# Patient Record
Sex: Female | Born: 1965 | Race: Black or African American | Hispanic: No | Smoking: Current every day smoker
Health system: Southern US, Community
[De-identification: ages and names within clinical notes are randomized; demographics above are authoritative.]

## PROBLEM LIST (undated history)

## (undated) DIAGNOSIS — I509 Heart failure, unspecified: Secondary | ICD-10-CM

---

## 2015-03-24 ENCOUNTER — Inpatient Hospital Stay (HOSPITAL_COMMUNITY): Payer: BLUE CROSS/BLUE SHIELD

## 2015-03-24 ENCOUNTER — Encounter (HOSPITAL_COMMUNITY): Admission: RE | Disposition: E | Payer: Self-pay | Source: Home / Self Care | Attending: Pulmonary Disease

## 2015-03-24 ENCOUNTER — Emergency Department (HOSPITAL_COMMUNITY): Payer: BLUE CROSS/BLUE SHIELD

## 2015-03-24 ENCOUNTER — Encounter (HOSPITAL_COMMUNITY): Payer: Self-pay

## 2015-03-24 DIAGNOSIS — I429 Cardiomyopathy, unspecified: Secondary | ICD-10-CM | POA: Diagnosis present

## 2015-03-24 DIAGNOSIS — I5023 Acute on chronic systolic (congestive) heart failure: Secondary | ICD-10-CM | POA: Diagnosis present

## 2015-03-24 DIAGNOSIS — I2119 ST elevation (STEMI) myocardial infarction involving other coronary artery of inferior wall: Secondary | ICD-10-CM | POA: Diagnosis present

## 2015-03-24 DIAGNOSIS — Z9114 Patient's other noncompliance with medication regimen: Secondary | ICD-10-CM | POA: Diagnosis present

## 2015-03-24 DIAGNOSIS — I4901 Ventricular fibrillation: Secondary | ICD-10-CM | POA: Diagnosis present

## 2015-03-24 DIAGNOSIS — J811 Chronic pulmonary edema: Secondary | ICD-10-CM

## 2015-03-24 DIAGNOSIS — R739 Hyperglycemia, unspecified: Secondary | ICD-10-CM | POA: Diagnosis present

## 2015-03-24 DIAGNOSIS — Z515 Encounter for palliative care: Secondary | ICD-10-CM

## 2015-03-24 DIAGNOSIS — Z66 Do not resuscitate: Secondary | ICD-10-CM

## 2015-03-24 DIAGNOSIS — G931 Anoxic brain damage, not elsewhere classified: Secondary | ICD-10-CM | POA: Insufficient documentation

## 2015-03-24 DIAGNOSIS — R570 Cardiogenic shock: Secondary | ICD-10-CM

## 2015-03-24 DIAGNOSIS — E872 Acidosis: Secondary | ICD-10-CM | POA: Diagnosis present

## 2015-03-24 DIAGNOSIS — E876 Hypokalemia: Secondary | ICD-10-CM | POA: Diagnosis present

## 2015-03-24 DIAGNOSIS — I272 Other secondary pulmonary hypertension: Secondary | ICD-10-CM | POA: Diagnosis present

## 2015-03-24 DIAGNOSIS — Z9119 Patient's noncompliance with other medical treatment and regimen: Secondary | ICD-10-CM | POA: Diagnosis present

## 2015-03-24 DIAGNOSIS — F17210 Nicotine dependence, cigarettes, uncomplicated: Secondary | ICD-10-CM | POA: Diagnosis present

## 2015-03-24 DIAGNOSIS — J14 Pneumonia due to Hemophilus influenzae: Secondary | ICD-10-CM | POA: Diagnosis present

## 2015-03-24 DIAGNOSIS — G253 Myoclonus: Secondary | ICD-10-CM | POA: Diagnosis present

## 2015-03-24 DIAGNOSIS — N179 Acute kidney failure, unspecified: Secondary | ICD-10-CM | POA: Diagnosis present

## 2015-03-24 DIAGNOSIS — I213 ST elevation (STEMI) myocardial infarction of unspecified site: Secondary | ICD-10-CM

## 2015-03-24 DIAGNOSIS — I2510 Atherosclerotic heart disease of native coronary artery without angina pectoris: Secondary | ICD-10-CM

## 2015-03-24 DIAGNOSIS — I2102 ST elevation (STEMI) myocardial infarction involving left anterior descending coronary artery: Secondary | ICD-10-CM

## 2015-03-24 DIAGNOSIS — R4020 Unspecified coma: Secondary | ICD-10-CM | POA: Diagnosis not present

## 2015-03-24 DIAGNOSIS — J9601 Acute respiratory failure with hypoxia: Secondary | ICD-10-CM | POA: Diagnosis present

## 2015-03-24 DIAGNOSIS — D649 Anemia, unspecified: Secondary | ICD-10-CM | POA: Diagnosis present

## 2015-03-24 DIAGNOSIS — I219 Acute myocardial infarction, unspecified: Secondary | ICD-10-CM | POA: Insufficient documentation

## 2015-03-24 DIAGNOSIS — Z978 Presence of other specified devices: Secondary | ICD-10-CM

## 2015-03-24 DIAGNOSIS — R0902 Hypoxemia: Secondary | ICD-10-CM | POA: Insufficient documentation

## 2015-03-24 DIAGNOSIS — E870 Hyperosmolality and hypernatremia: Secondary | ICD-10-CM | POA: Diagnosis not present

## 2015-03-24 DIAGNOSIS — I469 Cardiac arrest, cause unspecified: Secondary | ICD-10-CM | POA: Diagnosis present

## 2015-03-24 DIAGNOSIS — Z4659 Encounter for fitting and adjustment of other gastrointestinal appliance and device: Secondary | ICD-10-CM

## 2015-03-24 HISTORY — DX: Heart failure, unspecified: I50.9

## 2015-03-24 HISTORY — PX: CARDIAC CATHETERIZATION: SHX172

## 2015-03-24 LAB — I-STAT CHEM 8, ED
BUN: 22 mg/dL — ABNORMAL HIGH (ref 6–20)
CALCIUM ION: 1 mmol/L — AB (ref 1.12–1.23)
Chloride: 106 mmol/L (ref 101–111)
Creatinine, Ser: 1.3 mg/dL — ABNORMAL HIGH (ref 0.44–1.00)
GLUCOSE: 263 mg/dL — AB (ref 65–99)
HEMATOCRIT: 34 % — AB (ref 36.0–46.0)
HEMOGLOBIN: 11.6 g/dL — AB (ref 12.0–15.0)
Potassium: 3.4 mmol/L — ABNORMAL LOW (ref 3.5–5.1)
SODIUM: 141 mmol/L (ref 135–145)
TCO2: 18 mmol/L (ref 0–100)

## 2015-03-24 LAB — CBC WITH DIFFERENTIAL/PLATELET
BASOS ABS: 0 10*3/uL (ref 0.0–0.1)
Basophils Relative: 0 % (ref 0–1)
Eosinophils Absolute: 0.3 10*3/uL (ref 0.0–0.7)
Eosinophils Relative: 1 % (ref 0–5)
HCT: 32.7 % — ABNORMAL LOW (ref 36.0–46.0)
Hemoglobin: 8.9 g/dL — ABNORMAL LOW (ref 12.0–15.0)
LYMPHS ABS: 2.4 10*3/uL (ref 0.7–4.0)
LYMPHS PCT: 8 % — AB (ref 12–46)
MCH: 19.3 pg — ABNORMAL LOW (ref 26.0–34.0)
MCHC: 27.2 g/dL — ABNORMAL LOW (ref 30.0–36.0)
MCV: 70.8 fL — ABNORMAL LOW (ref 78.0–100.0)
MONO ABS: 0.9 10*3/uL (ref 0.1–1.0)
Monocytes Relative: 3 % (ref 3–12)
Neutro Abs: 26.7 10*3/uL — ABNORMAL HIGH (ref 1.7–7.7)
Neutrophils Relative %: 88 % — ABNORMAL HIGH (ref 43–77)
PLATELETS: 268 10*3/uL (ref 150–400)
RBC: 4.62 MIL/uL (ref 3.87–5.11)
RDW: 19.9 % — ABNORMAL HIGH (ref 11.5–15.5)
WBC: 30.3 10*3/uL — ABNORMAL HIGH (ref 4.0–10.5)

## 2015-03-24 LAB — POCT I-STAT, CHEM 8
BUN: 22 mg/dL — ABNORMAL HIGH (ref 6–20)
BUN: 21 mg/dL — AB (ref 6–20)
CALCIUM ION: 1.14 mmol/L (ref 1.12–1.23)
CHLORIDE: 106 mmol/L (ref 101–111)
CREATININE: 1.2 mg/dL — AB (ref 0.44–1.00)
Calcium, Ion: 1.11 mmol/L — ABNORMAL LOW (ref 1.12–1.23)
Chloride: 104 mmol/L (ref 101–111)
Creatinine, Ser: 1.1 mg/dL — ABNORMAL HIGH (ref 0.44–1.00)
Glucose, Bld: 210 mg/dL — ABNORMAL HIGH (ref 65–99)
Glucose, Bld: 160 mg/dL — ABNORMAL HIGH (ref 65–99)
HCT: 35 % — ABNORMAL LOW (ref 36.0–46.0)
HCT: 36 % (ref 36.0–46.0)
HEMOGLOBIN: 11.9 g/dL — AB (ref 12.0–15.0)
Hemoglobin: 12.2 g/dL (ref 12.0–15.0)
POTASSIUM: 3.5 mmol/L (ref 3.5–5.1)
Potassium: 3.4 mmol/L — ABNORMAL LOW (ref 3.5–5.1)
SODIUM: 141 mmol/L (ref 135–145)
Sodium: 143 mmol/L (ref 135–145)
TCO2: 19 mmol/L (ref 0–100)
TCO2: 20 mmol/L (ref 0–100)

## 2015-03-24 LAB — BASIC METABOLIC PANEL
ANION GAP: 9 (ref 5–15)
Anion gap: 8 (ref 5–15)
BUN: 22 mg/dL — AB (ref 6–20)
BUN: 20 mg/dL (ref 6–20)
CO2: 22 mmol/L (ref 22–32)
CO2: 23 mmol/L (ref 22–32)
Calcium: 7.6 mg/dL — ABNORMAL LOW (ref 8.9–10.3)
Calcium: 8.2 mg/dL — ABNORMAL LOW (ref 8.9–10.3)
Chloride: 109 mmol/L (ref 101–111)
Chloride: 109 mmol/L (ref 101–111)
Creatinine, Ser: 1.4 mg/dL — ABNORMAL HIGH (ref 0.44–1.00)
Creatinine, Ser: 1.14 mg/dL — ABNORMAL HIGH (ref 0.44–1.00)
GFR calc Af Amer: >60 mL/min (ref >60)
GFR calc non Af Amer: 44 mL/min — ABNORMAL LOW (ref >60)
GFR, EST AFRICAN AMERICAN: 51 mL/min — AB (ref >60)
GFR, EST NON AFRICAN AMERICAN: 57 mL/min — AB (ref >60)
GLUCOSE: 220 mg/dL — AB (ref 65–99)
GLUCOSE: 139 mg/dL — AB (ref 65–99)
Potassium: 3.4 mmol/L — ABNORMAL LOW (ref 3.5–5.1)
Potassium: 3.8 mmol/L (ref 3.5–5.1)
SODIUM: 139 mmol/L (ref 135–145)
SODIUM: 141 mmol/L (ref 135–145)

## 2015-03-24 LAB — I-STAT CG4 LACTIC ACID, ED: Lactic Acid, Venous: 6.15 mmol/L (ref 0.5–2.0)

## 2015-03-24 LAB — POCT I-STAT 3, ART BLOOD GAS (G3+)
Acid-base deficit: 8 mmol/L — ABNORMAL HIGH (ref 0.0–2.0)
Acid-base deficit: 5 mmol/L — ABNORMAL HIGH (ref 0.0–2.0)
BICARBONATE: 19.6 meq/L — AB (ref 20.0–24.0)
BICARBONATE: 20.9 meq/L (ref 20.0–24.0)
O2 Saturation: 99 %
O2 Saturation: 100 %
PO2 ART: 295 mmHg — AB (ref 80.0–100.0)
Patient temperature: 35.5
TCO2: 21 mmol/L (ref 0–100)
TCO2: 22 mmol/L (ref 0–100)
pCO2 arterial: 45.2 mmHg — ABNORMAL HIGH (ref 35.0–45.0)
pCO2 arterial: 38.4 mmHg (ref 35.0–45.0)
pH, Arterial: 7.243 — ABNORMAL LOW (ref 7.350–7.450)
pH, Arterial: 7.336 — ABNORMAL LOW (ref 7.350–7.450)
pO2, Arterial: 135 mmHg — ABNORMAL HIGH (ref 80.0–100.0)

## 2015-03-24 LAB — I-STAT TROPONIN, ED: TROPONIN I, POC: 0.15 ng/mL — AB (ref 0.00–0.08)

## 2015-03-24 LAB — COMPREHENSIVE METABOLIC PANEL
ALBUMIN: 3 g/dL — AB (ref 3.5–5.0)
ALT: 55 U/L — ABNORMAL HIGH (ref 14–54)
ANION GAP: 15 (ref 5–15)
AST: 84 U/L — ABNORMAL HIGH (ref 15–41)
Alkaline Phosphatase: 52 U/L (ref 38–126)
BILIRUBIN TOTAL: 1 mg/dL (ref 0.3–1.2)
BUN: 18 mg/dL (ref 6–20)
CO2: 18 mmol/L — ABNORMAL LOW (ref 22–32)
Calcium: 7.6 mg/dL — ABNORMAL LOW (ref 8.9–10.3)
Chloride: 105 mmol/L (ref 101–111)
Creatinine, Ser: 1.36 mg/dL — ABNORMAL HIGH (ref 0.44–1.00)
GFR calc Af Amer: 53 mL/min — ABNORMAL LOW (ref >60)
GFR calc non Af Amer: 46 mL/min — ABNORMAL LOW (ref >60)
GLUCOSE: 265 mg/dL — AB (ref 65–99)
Potassium: 3.1 mmol/L — ABNORMAL LOW (ref 3.5–5.1)
SODIUM: 138 mmol/L (ref 135–145)
Total Protein: 6.1 g/dL — ABNORMAL LOW (ref 6.5–8.1)

## 2015-03-24 LAB — GLUCOSE, CAPILLARY
GLUCOSE-CAPILLARY: 174 mg/dL — AB (ref 65–99)
GLUCOSE-CAPILLARY: 167 mg/dL — AB (ref 65–99)
GLUCOSE-CAPILLARY: 84 mg/dL (ref 65–99)
GLUCOSE-CAPILLARY: 145 mg/dL — AB (ref 65–99)
GLUCOSE-CAPILLARY: 151 mg/dL — AB (ref 65–99)
Glucose-Capillary: 210 mg/dL — ABNORMAL HIGH (ref 65–99)
Glucose-Capillary: 147 mg/dL — ABNORMAL HIGH (ref 65–99)
Glucose-Capillary: 175 mg/dL — ABNORMAL HIGH (ref 65–99)

## 2015-03-24 LAB — RAPID URINE DRUG SCREEN, HOSP PERFORMED
Amphetamines: POSITIVE — AB
Barbiturates: NOT DETECTED
Benzodiazepines: NOT DETECTED
COCAINE: NOT DETECTED
Opiates: NOT DETECTED
Tetrahydrocannabinol: NOT DETECTED

## 2015-03-24 LAB — POCT ACTIVATED CLOTTING TIME: Activated Clotting Time: 374 seconds

## 2015-03-24 LAB — PROTIME-INR
INR: 1.41 (ref 0.00–1.49)
INR: 1.6 — ABNORMAL HIGH (ref 0.00–1.49)
INR: 1.95 — ABNORMAL HIGH (ref 0.00–1.49)
PROTHROMBIN TIME: 22.1 s — AB (ref 11.6–15.2)
Prothrombin Time: 17.4 seconds — ABNORMAL HIGH (ref 11.6–15.2)
Prothrombin Time: 19.1 seconds — ABNORMAL HIGH (ref 11.6–15.2)

## 2015-03-24 LAB — PLATELET COUNT: Platelets: 289 10*3/uL (ref 150–400)

## 2015-03-24 LAB — MRSA PCR SCREENING: MRSA by PCR: NEGATIVE

## 2015-03-24 LAB — TROPONIN I
Troponin I: 18.61 ng/mL (ref <0.031)
Troponin I: 40.99 ng/mL (ref <0.031)

## 2015-03-24 LAB — MAGNESIUM: Magnesium: 2 mg/dL (ref 1.7–2.4)

## 2015-03-24 LAB — APTT
APTT: 26 s (ref 24–37)
aPTT: 36 seconds (ref 24–37)

## 2015-03-24 SURGERY — LEFT HEART CATH AND CORONARY ANGIOGRAPHY

## 2015-03-24 MED ORDER — HEPARIN (PORCINE) IN NACL 100-0.45 UNIT/ML-% IJ SOLN
1600.0000 [IU]/h | INTRAMUSCULAR | Status: DC
  Administered 2015-03-24: 650 [IU]/h via INTRAVENOUS
  Administered 2015-03-25: 1100 [IU]/h via INTRAVENOUS
  Administered 2015-03-26: 1450 [IU]/h via INTRAVENOUS
  Administered 2015-03-27 – 2015-03-29 (×5): 1700 [IU]/h via INTRAVENOUS
  Filled 2015-03-24 (×19): qty 250

## 2015-03-24 MED ORDER — SODIUM CHLORIDE 0.9 % IJ SOLN
3.0000 mL | Freq: Two times a day (BID) | INTRAMUSCULAR | Status: DC
  Administered 2015-03-24 – 2015-03-30 (×11): 3 mL via INTRAVENOUS

## 2015-03-24 MED ORDER — SODIUM CHLORIDE 0.9 % IJ SOLN
3.0000 mL | Freq: Two times a day (BID) | INTRAMUSCULAR | Status: DC
  Administered 2015-03-25 – 2015-03-27 (×3): 3 mL via INTRAVENOUS
  Administered 2015-03-28: 10 mL via INTRAVENOUS
  Administered 2015-03-30 – 2015-04-01 (×3): 3 mL via INTRAVENOUS

## 2015-03-24 MED ORDER — CHLORHEXIDINE GLUCONATE 0.12 % MT SOLN
15.0000 mL | Freq: Two times a day (BID) | OROMUCOSAL | Status: DC
  Administered 2015-03-24 – 2015-03-31 (×14): 15 mL via OROMUCOSAL
  Filled 2015-03-24 (×12): qty 15

## 2015-03-24 MED ORDER — SODIUM CHLORIDE 0.9 % IV SOLN
25.0000 ug/h | INTRAVENOUS | Status: DC
  Administered 2015-03-24: 50 ug/h via INTRAVENOUS
  Administered 2015-03-25 (×2): 200 ug/h via INTRAVENOUS
  Filled 2015-03-24 (×3): qty 50

## 2015-03-24 MED ORDER — LIDOCAINE HCL (CARDIAC) 20 MG/ML IV SOLN
INTRAVENOUS | Status: AC
  Filled 2015-03-24: qty 5

## 2015-03-24 MED ORDER — HEPARIN SODIUM (PORCINE) 1000 UNIT/ML IJ SOLN
INTRAMUSCULAR | Status: AC
  Filled 2015-03-24: qty 1

## 2015-03-24 MED ORDER — NOREPINEPHRINE BITARTRATE 1 MG/ML IV SOLN
0.0000 ug/min | INTRAVENOUS | Status: DC
  Filled 2015-03-24: qty 4

## 2015-03-24 MED ORDER — ONDANSETRON HCL 4 MG/2ML IJ SOLN: 4.0000 mg | Freq: Four times a day (QID) | INTRAMUSCULAR | Status: DC | PRN

## 2015-03-24 MED ORDER — ROCURONIUM BROMIDE 50 MG/5ML IV SOLN
INTRAVENOUS | Status: AC
  Administered 2015-03-24: 70 mg
  Filled 2015-03-24: qty 2

## 2015-03-24 MED ORDER — HEPARIN SODIUM (PORCINE) 1000 UNIT/ML IJ SOLN
INTRAMUSCULAR | Status: DC | PRN
  Administered 2015-03-24: 4000 [IU] via INTRAVENOUS

## 2015-03-24 MED ORDER — SODIUM CHLORIDE 0.9 % IJ SOLN: 3.0000 mL | INTRAMUSCULAR | Status: DC | PRN

## 2015-03-24 MED ORDER — FENTANYL CITRATE (PF) 100 MCG/2ML IJ SOLN: 100.0000 ug | Freq: Once | INTRAMUSCULAR | Status: DC

## 2015-03-24 MED ORDER — HEPARIN (PORCINE) IN NACL 2-0.9 UNIT/ML-% IJ SOLN
INTRAMUSCULAR | Status: AC
  Filled 2015-03-24: qty 1000

## 2015-03-24 MED ORDER — FENTANYL BOLUS VIA INFUSION
50.0000 ug | INTRAVENOUS | Status: DC | PRN
  Administered 2015-03-26: 50 ug via INTRAVENOUS
  Filled 2015-03-24: qty 50

## 2015-03-24 MED ORDER — ETOMIDATE 2 MG/ML IV SOLN
INTRAVENOUS | Status: AC
  Administered 2015-03-24: 30 mg
  Filled 2015-03-24: qty 20

## 2015-03-24 MED ORDER — POTASSIUM CHLORIDE 10 MEQ/100ML IV SOLN
INTRAVENOUS | Status: AC
  Filled 2015-03-24: qty 100

## 2015-03-24 MED ORDER — TIROFIBAN HCL IV 5 MG/100ML
INTRAVENOUS | Status: AC
  Filled 2015-03-24: qty 100

## 2015-03-24 MED ORDER — ACETAMINOPHEN 325 MG PO TABS: 650.0000 mg | ORAL_TABLET | ORAL | Status: DC | PRN

## 2015-03-24 MED ORDER — SODIUM CHLORIDE 0.9 % IV SOLN
25.0000 ug/h | INTRAVENOUS | Status: DC
  Filled 2015-03-24: qty 50

## 2015-03-24 MED ORDER — TICAGRELOR 90 MG PO TABS
ORAL_TABLET | ORAL | Status: AC
  Filled 2015-03-24: qty 2

## 2015-03-24 MED ORDER — TICAGRELOR 90 MG PO TABS
90.0000 mg | ORAL_TABLET | Freq: Two times a day (BID) | ORAL | Status: DC
  Administered 2015-03-24 – 2015-03-25 (×2): 90 mg via ORAL
  Filled 2015-03-24 (×4): qty 1

## 2015-03-24 MED ORDER — SODIUM CHLORIDE 0.9 % IV SOLN: 2000.0000 mL | Freq: Once | INTRAVENOUS | Status: AC

## 2015-03-24 MED ORDER — SODIUM CHLORIDE 0.9 % IV SOLN
250.0000 mg | INTRAVENOUS | Status: DC | PRN
  Administered 2015-03-24 (×2): 1.75 mg/kg/h via INTRAVENOUS

## 2015-03-24 MED ORDER — NOREPINEPHRINE BITARTRATE 1 MG/ML IV SOLN
0.0000 ug/min | INTRAVENOUS | Status: DC
  Administered 2015-03-24: 5 ug/min via INTRAVENOUS
  Administered 2015-03-25: 7 ug/min via INTRAVENOUS
  Filled 2015-03-24 (×3): qty 4

## 2015-03-24 MED ORDER — INSULIN ASPART 100 UNIT/ML ~~LOC~~ SOLN
2.0000 [IU] | SUBCUTANEOUS | Status: DC
  Administered 2015-03-24: 2 [IU] via SUBCUTANEOUS
  Administered 2015-03-25 (×2): 4 [IU] via SUBCUTANEOUS
  Administered 2015-03-25: 2 [IU] via SUBCUTANEOUS
  Filled 2015-03-24: qty 0.06

## 2015-03-24 MED ORDER — SODIUM CHLORIDE 0.9 % IV SOLN
1.0000 ug/kg/min | INTRAVENOUS | Status: DC
  Filled 2015-03-24: qty 20

## 2015-03-24 MED ORDER — POTASSIUM CHLORIDE 10 MEQ/100ML IV SOLN
INTRAVENOUS | Status: DC | PRN
  Administered 2015-03-24: 10 meq via INTRAVENOUS

## 2015-03-24 MED ORDER — POTASSIUM CHLORIDE 20 MEQ/15ML (10%) PO SOLN
40.0000 meq | Freq: Once | ORAL | Status: AC
  Administered 2015-03-24: 40 meq
  Filled 2015-03-24 (×2): qty 30

## 2015-03-24 MED ORDER — SODIUM CHLORIDE 0.9 % IV SOLN
1.0000 mg/h | INTRAVENOUS | Status: DC
  Filled 2015-03-24: qty 10

## 2015-03-24 MED ORDER — CISATRACURIUM BOLUS VIA INFUSION
0.1000 mg/kg | Freq: Once | INTRAVENOUS | Status: AC
  Administered 2015-03-24: 8.2 mg via INTRAVENOUS
  Filled 2015-03-24: qty 9

## 2015-03-24 MED ORDER — SODIUM CHLORIDE 0.9 % IV SOLN
250.0000 mL | INTRAVENOUS | Status: DC | PRN
  Administered 2015-03-26 – 2015-03-29 (×3): 250 mL via INTRAVENOUS

## 2015-03-24 MED ORDER — NITROGLYCERIN 1 MG/10 ML FOR IR/CATH LAB
INTRA_ARTERIAL | Status: AC
  Filled 2015-03-24: qty 10

## 2015-03-24 MED ORDER — PROPOFOL 1000 MG/100ML IV EMUL: 5.0000 ug/kg/min | INTRAVENOUS | Status: DC

## 2015-03-24 MED ORDER — VERAPAMIL HCL 2.5 MG/ML IV SOLN
INTRAVENOUS | Status: AC
  Filled 2015-03-24: qty 2

## 2015-03-24 MED ORDER — PANTOPRAZOLE SODIUM 40 MG IV SOLR
40.0000 mg | Freq: Every day | INTRAVENOUS | Status: DC
  Administered 2015-03-24 – 2015-03-26 (×3): 40 mg via INTRAVENOUS
  Filled 2015-03-24 (×4): qty 40

## 2015-03-24 MED ORDER — SODIUM CHLORIDE 0.9 % IV SOLN
1.0000 ug/kg/min | INTRAVENOUS | Status: DC
  Administered 2015-03-24: 1 ug/kg/min via INTRAVENOUS
  Administered 2015-03-25: 1.5 ug/kg/min via INTRAVENOUS
  Filled 2015-03-24 (×2): qty 20

## 2015-03-24 MED ORDER — BIVALIRUDIN 250 MG IV SOLR
INTRAVENOUS | Status: AC
  Filled 2015-03-24: qty 250

## 2015-03-24 MED ORDER — ASPIRIN 81 MG PO CHEW
81.0000 mg | CHEWABLE_TABLET | Freq: Every day | ORAL | Status: DC
  Administered 2015-03-25: 81 mg via ORAL
  Filled 2015-03-24: qty 1

## 2015-03-24 MED ORDER — ASPIRIN 300 MG RE SUPP
300.0000 mg | RECTAL | Status: AC
  Administered 2015-03-24: 300 mg via RECTAL
  Filled 2015-03-24: qty 1

## 2015-03-24 MED ORDER — SODIUM CHLORIDE 0.9 % IV SOLN
1.0000 mg/h | INTRAVENOUS | Status: DC
  Administered 2015-03-24 – 2015-03-25 (×3): 2 mg/h via INTRAVENOUS
  Filled 2015-03-24 (×3): qty 10

## 2015-03-24 MED ORDER — CETYLPYRIDINIUM CHLORIDE 0.05 % MT LIQD
7.0000 mL | Freq: Four times a day (QID) | OROMUCOSAL | Status: DC
  Administered 2015-03-25 – 2015-03-31 (×28): 7 mL via OROMUCOSAL

## 2015-03-24 MED ORDER — ARTIFICIAL TEARS OP OINT
1.0000 "application " | TOPICAL_OINTMENT | Freq: Three times a day (TID) | OPHTHALMIC | Status: DC
  Administered 2015-03-24 – 2015-03-27 (×10): 1 via OPHTHALMIC
  Filled 2015-03-24 (×2): qty 3.5

## 2015-03-24 MED ORDER — SUCCINYLCHOLINE CHLORIDE 20 MG/ML IJ SOLN
INTRAMUSCULAR | Status: AC
  Filled 2015-03-24: qty 1

## 2015-03-24 MED ORDER — TIROFIBAN (AGGRASTAT) BOLUS VIA INFUSION
INTRAVENOUS | Status: DC | PRN
  Administered 2015-03-24 (×2): 2040 ug via INTRAVENOUS

## 2015-03-24 MED ORDER — LIDOCAINE HCL (PF) 1 % IJ SOLN
INTRAMUSCULAR | Status: AC
  Filled 2015-03-24: qty 30

## 2015-03-24 MED ORDER — VERAPAMIL HCL 2.5 MG/ML IV SOLN
INTRAVENOUS | Status: DC | PRN
  Administered 2015-03-24: 12:00:00 via INTRA_ARTERIAL

## 2015-03-24 MED ORDER — CISATRACURIUM BOLUS VIA INFUSION
0.0500 mg/kg | INTRAVENOUS | Status: DC | PRN
  Filled 2015-03-24: qty 5

## 2015-03-24 MED ORDER — SODIUM CHLORIDE 0.9 % IV SOLN
250.0000 mL | INTRAVENOUS | Status: DC | PRN
  Administered 2015-03-25 – 2015-03-29 (×2): 250 mL via INTRAVENOUS

## 2015-03-24 SURGICAL SUPPLY — 22 items
BALLN EUPHORA RX 2.5X12 (BALLOONS) ×3
BALLOON EUPHORA RX 2.5X12 (BALLOONS) ×1 IMPLANT
CATH EXTRAC PRONTO 5.5F 138CM (CATHETERS) ×3 IMPLANT
CATH INFINITI 5 FR JL3.5 (CATHETERS) ×3 IMPLANT
CATH INFINITI 5FR ANG PIGTAIL (CATHETERS) ×3 IMPLANT
CATH INFINITI 5FR MULTPACK ANG (CATHETERS) IMPLANT
CATH INFINITI JR4 5F (CATHETERS) ×3 IMPLANT
CATH VISTA GUIDE 6FR XBLAD3.5 (CATHETERS) ×3 IMPLANT
DEVICE RAD COMP TR BAND LRG (VASCULAR PRODUCTS) ×3 IMPLANT
GLIDESHEATH SLEND SS 6F .021 (SHEATH) ×3 IMPLANT
HOVERMATT SINGLE USE (MISCELLANEOUS) ×3 IMPLANT
KIT ENCORE 26 ADVANTAGE (KITS) ×3 IMPLANT
KIT HEART LEFT (KITS) ×3 IMPLANT
PACK CARDIAC CATHETERIZATION (CUSTOM PROCEDURE TRAY) ×3 IMPLANT
SHEATH PINNACLE 5F 10CM (SHEATH) IMPLANT
SYR MEDRAD MARK V 150ML (SYRINGE) ×3 IMPLANT
TRANSDUCER W/STOPCOCK (MISCELLANEOUS) ×3 IMPLANT
TUBING CIL FLEX 10 FLL-RA (TUBING) ×3 IMPLANT
WIRE COUGAR XT STRL 190CM (WIRE) ×3 IMPLANT
WIRE EMERALD 3MM-J .035X150CM (WIRE) IMPLANT
WIRE HI TORQ VERSACORE-J 145CM (WIRE) ×3 IMPLANT
WIRE SAFE-T 1.5MM-J .035X260CM (WIRE) ×3 IMPLANT

## 2015-03-24 NOTE — Procedures (Signed)
Central Venous Catheter Insertion Procedure Note Anna Kemp [id number] [birth year]  Procedure: Insertion of Central Venous Catheter Indications: Assessment of intravascular volume, Drug and/or fluid administration and Frequent blood sampling  Procedure Details Consent: Unable to obtain consent because of emergent medical necessity. Time Out: Verified patient identification, verified procedure, site/side was marked, verified correct patient position, special equipment/implants available, medications/allergies/relevent history reviewed, required imaging and test results available.  Performed  Maximum sterile technique was used including antiseptics, cap, gloves, gown, hand hygiene, mask and sheet. Skin prep: Chlorhexidine; local anesthetic administered A antimicrobial bonded/coated triple lumen catheter was placed in the left subclavian vein using the Seldinger technique.  Evaluation Blood flow good Complications: No apparent complications Patient did tolerate procedure well. Chest X-ray ordered to verify placement.  CXR: pending.  U/S used in placement.  YACOUB,WESAM 03/10/2015, 11:10 AM

## 2015-03-24 NOTE — Progress Notes (Signed)
eLink Physician-Brief Progress Note Patient Name: Anna Kemp DOB: 10-06-1965 MRN: [id number]   Date of Service  2015/04/06  HPI/Events of Note   Recent Labs Lab 04/06/2015 1037 April 06, 2015 1047 April 06, 2015 1434 04-06-15 1435 04/06/15 1842  K 3.1* 3.4* 3.4* 3.4* 3.5    Recent Labs Lab 06-Apr-2015 1037 Apr 06, 2015 1047 04/06/2015 1434 Apr 06, 2015 1435 April 06, 2015 1842  CREATININE 1.36* 1.30* 1.20* 1.40* 1.10*      eICU Interventions  Low k  Repeat kcl     Intervention Category Minor Interventions: Electrolytes abnormality - evaluation and management  Markanthony Gedney 04-06-15, 7:21 PM

## 2015-03-24 NOTE — H&P (View-Only) (Signed)
CARDIOLOGY CONSULT NOTE  Patient ID: Anna Kemp, MRN: [id number], DOB/AGE: 1965/10/20 49 y.o. Admit date: Mar 30, 2015 Date of Consult: 30-Mar-2015  Primary Physician: No primary care provider on file. Primary Cardiologist: none Referring Physician: Dr Molli Knock  Chief Complaint: Out-of-hospital arrest/STEMI Reason for Consultation: same  HPI:  49 year old woman with history of congestive heart failure. Specifics of her cardiac history are not well known. She was at home this morning with her 63 year old daughter and she was complaining of her chest burning. She was drinking orange juice in order to relieve the discomfort. She's had increasing shortness of breath recently and has been taking her diarrhetic. She is on no other medications. She has been seen by a cardiologist and [facility] but has not had any recent follow-up because of financial difficulty. She suddenly fell to the ground and became unresponsive. EMS was called and her daughter perform CPR.  Upon EMS arrival she was in ventricular fibrillation and she required multiple defibrillations. There was a period of PEA. Time to return of spontaneous circulation was  Approximately 25 minutes.  Her post-resuscitation EKG shows marked inferolateral ST segment elevation consistent with acute ST elevation MI. The patient is unconscious and intubated at the time of my evaluation.  Medical History:  Past Medical History  Diagnosis Date  . CHF (congestive heart failure)       Surgical History: History reviewed. No pertinent past surgical history.   Home Meds: Prior to Admission medications   Medication Sig Start Date End Date Taking? Authorizing Provider  bumetanide (BUMEX) 1 MG tablet Take 1 mg by mouth daily. 03/14/15   Historical Provider, MD  pantoprazole (PROTONIX) 40 MG tablet Take 40 mg by mouth daily. 01/03/15   Historical Provider, MD    Inpatient Medications:  . sodium chloride  2,000 mL Intravenous Once  . sodium chloride  2,000  mL Intravenous Once  . [MAR Hold] artificial tears  1 application Both Eyes 3 times per day  . aspirin  300 mg Rectal NOW  . [MAR Hold] cisatracurium  0.1 mg/kg Intravenous Once  . fentaNYL (SUBLIMAZE) injection  100 mcg Intravenous Once  . lidocaine (cardiac) 100 mg/66ml      . [MAR Hold] pantoprazole (PROTONIX) IV  40 mg Intravenous QHS   . [MAR Hold] cisatracurium (NIMBEX) infusion    . fentaNYL infusion INTRAVENOUS    . midazolam (VERSED) infusion    . [MAR Hold] norepinephrine (LEVOPHED) Adult infusion      Allergies: Allergies not on file  History   Social History  . Marital Status: Unknown    Spouse Name: N/A  . Number of Children: N/A  . Years of Education: N/A   Occupational History  . Not on file.   Social History Main Topics  . Smoking status: Current Every Day Smoker  . Smokeless tobacco: Not on file  . Alcohol Use: Not on file  . Drug Use: Not on file  . Sexual Activity: Not on file   Other Topics Concern  . Not on file   Social History Narrative    The patient lives with her daughter and [name].     Family History  Problem Relation Age of Onset  . Congestive Heart Failure Mother   . Coronary artery disease Mother      Review of Systems:  unable to obtain  Physical Exam: Blood pressure 79/58, pulse 105, temperature 98.3 F (36.8 C), temperature source Rectal, resp. rate 31, height 5\' 7"  (1.702 m), weight 180  lb (81.647 kg), last menstrual period 03/19/2015, SpO2 100 %. Pt is  Intubated and unresponsive HEENT: normal. No signs of head trauma Neck:  Neck collar in place Lungs: equal expansion, clear bilaterally CV: Apex is discrete and nondisplaced, RRR without murmur or gallop Abd: soft,  No masses, bowel sounds are present Back: no  Obvious deformity Ext: no C/C/E Skin: warm and dry without rash Neuro: CNII-XII intact             Strength intact = bilaterally   Labs: No results for input(s): CKTOTAL, CKMB, TROPONINI in the  last 72 hours. Lab Results  Component Value Date   WBC 30.3* 03/03/2015   HGB 11.6* 03/08/2015   HCT 34.0* 03/01/2015   MCV 70.8* 03/03/2015   PLT 268 02/26/2015     Recent Labs Lab 03/23/2015 1037 02/24/2015 1047  NA 138 141  K 3.1* 3.4*  CL 105 106  CO2 18*  --   BUN 18 22*  CREATININE 1.36* 1.30*  CALCIUM 7.6*  --   PROT 6.1*  --   BILITOT 1.0  --   ALKPHOS 52  --   ALT 55*  --   AST 84*  --   GLUCOSE 265* 263*   No results found for: CHOL, HDL, LDLCALC, TRIG No results found for: DDIMER  Radiology/Studies:  No results found.  EKG:  Sinus rhythm with acute inferolateral ST segment elevation MI  Cardiac Studies: Pending   chest x-ray official interpretation is pending: Images reviewed and there is marked cardiomegaly and diffuse interstitial edema present  ASSESSMENT AND PLAN:  1. Out of hospital ventricular fibrillation cardiac arrest 2. Acute inferolateral ST segment elevation MI 3. Chronic congestive heart failure, specifics unknown  4. Medical noncompliance / limited resources 5. Tobacco abuse 6. Lactic acidosis , secondary to #1  7. Acute kidney injury , secondary to #1   emergency cardiac catheterization and probable PCI. Emergency  implied consent obtained.  Reviewed plan with the patient's daughter who understands and agrees to proceed.  Signed, Tesean Stump MD, FACC 03/11/2015, 11:24 AM  

## 2015-03-24 NOTE — Procedures (Signed)
Intubation Procedure Note Anna Kemp [id number] 1966-06-08  Procedure: Intubation Indications: Airway protection and maintenance  Procedure Details Consent: Unable to obtain consent because of altered level of consciousness. Time Out: Verified patient identification, verified procedure, site/side was marked, verified correct patient position, special equipment/implants available, medications/allergies/relevent history reviewed, required imaging and test results available.  Performed  Maximum sterile technique was used including gloves, gown, hand hygiene and mask.  MAC and 3    Evaluation Hemodynamic Status: BP stable throughout; O2 sats: stable throughout Patient's Current Condition: stable Complications: No apparent complications Patient did tolerate procedure well. Chest X-ray ordered to verify placement.  CXR: pending.  Pt intubated  Using Glidescope Blade 3 with 7.5 ett secured at 23 at top lip. PT presesented to ED with [facility] airway. King removed and intubated with no complications. Pt stable throughout. Bilateral BS, positive color change on etco2, CXR pending. RT will continue to monitor   Anna Kemp 03/08/2015

## 2015-03-24 NOTE — ED Provider Notes (Signed)
CSN: [id number]     Arrival date & time 2015-04-23  1032 History   First MD Initiated Contact with Patient Apr 23, 2015 1035     Chief Complaint  Patient presents with  . Cardiac Arrest     (Consider location/radiation/quality/duration/timing/severity/associated sxs/prior Treatment) The history is provided by the EMS personnel.   patient came in after witnessed cardiac arrest. Reportedly CPR started immediately. V. fib shocked and had return of vitals with a downtime 24 minutes. Reportedly has history of CHF. No trauma due to fall. Amiodarone given by EMS. May have been noncompliant with her medications. King airway in place by EMS.  Past Medical History  Diagnosis Date  . CHF (congestive heart failure)    Past Surgical History  Procedure Laterality Date  . Cardiac catheterization N/A 04/23/2015    Procedure: Left Heart Cath and Coronary Angiography;  Surgeon: Tonny Bollman, MD;  Location: [facility] INVASIVE CV LAB;  Service: Cardiovascular;  Laterality: N/A;   Family History  Problem Relation Age of Onset  . Congestive Heart Failure Mother   . Coronary artery disease Mother    History  Substance Use Topics  . Smoking status: Current Every Day Smoker  . Smokeless tobacco: Not on file  . Alcohol Use: Not on file   OB History    No data available     Review of Systems  Unable to perform ROS     Allergies  Review of patient's allergies indicates not on file.  Home Medications   Prior to Admission medications   Medication Sig Start Date End Date Taking? Authorizing Provider  bumetanide (BUMEX) 1 MG tablet Take 1 mg by mouth at bedtime.  03/14/15  Yes Historical Provider, MD  pantoprazole (PROTONIX) 40 MG tablet Take 40 mg by mouth daily as needed.  01/03/15  Yes Historical Provider, MD   BP 119/82 mmHg  Pulse 84  Temp(Src) 95.5 F (35.3 C) (Core (Comment))  Resp 18  Ht  (1.702 m)  Wt 180 lb (81.647 kg)  BMI 28.19 kg/m2  SpO2 100%  LMP 04/23/15 (LMP Unknown) Physical  Exam  Constitutional: She appears well-developed.  HENT:  Head: Atraumatic.  Neck: Neck supple.  Cardiovascular:  Regular rhythm  Pulmonary/Chest:  Equal breath sounds bilaterally with a King airway  Abdominal: She exhibits no distension.  Musculoskeletal: She exhibits no edema.  Neurological:  Patient will have mild withdraw from pain. Breathing spontaneously but will not open eyes or follow commands  Skin: Skin is warm.    ED Course  Procedures (including critical care time) Labs Review Labs Reviewed  COMPREHENSIVE METABOLIC PANEL - Abnormal; Notable for the following:    Potassium 3.1 (*)    CO2 18 (*)    Glucose, Bld 265 (*)    Creatinine, Ser 1.36 (*)    Calcium 7.6 (*)    Total Protein 6.1 (*)    Albumin 3.0 (*)    AST 84 (*)    ALT 55 (*)    GFR calc non Af Amer 46 (*)    GFR calc Af Amer 53 (*)    All other components within normal limits  CBC WITH DIFFERENTIAL/PLATELET - Abnormal; Notable for the following:    WBC 30.3 (*)    Hemoglobin 8.9 (*)    HCT 32.7 (*)    MCV 70.8 (*)    MCH 19.3 (*)    MCHC 27.2 (*)    RDW 19.9 (*)    Neutrophils Relative % 88 (*)    Lymphocytes  Relative 8 (*)    Neutro Abs 26.7 (*)    All other components within normal limits  PROTIME-INR - Abnormal; Notable for the following:    Prothrombin Time 17.4 (*)    All other components within normal limits  TROPONIN I - Abnormal; Notable for the following:    Troponin I 18.61 (*)    All other components within normal limits  BASIC METABOLIC PANEL - Abnormal; Notable for the following:    Potassium 3.4 (*)    Glucose, Bld 220 (*)    BUN 22 (*)    Creatinine, Ser 1.40 (*)    Calcium 7.6 (*)    GFR calc non Af Amer 44 (*)    GFR calc Af Amer 51 (*)    All other components within normal limits  URINE RAPID DRUG SCREEN, HOSP PERFORMED - Abnormal; Notable for the following:    Amphetamines POSITIVE (*)    All other components within normal limits  I-STAT CG4 LACTIC ACID, ED -  Abnormal; Notable for the following:    Lactic Acid, Venous 6.15 (*)    All other components within normal limits  I-STAT CHEM 8, ED - Abnormal; Notable for the following:    Potassium 3.4 (*)    BUN 22 (*)    Creatinine, Ser 1.30 (*)    Glucose, Bld 263 (*)    Calcium, Ion 1.00 (*)    Hemoglobin 11.6 (*)    HCT 34.0 (*)    All other components within normal limits  I-STAT TROPOININ, ED - Abnormal; Notable for the following:    Troponin i, poc 0.15 (*)    All other components within normal limits  POCT I-STAT 3, ART BLOOD GAS (G3+) - Abnormal; Notable for the following:    pH, Arterial 7.243 (*)    pCO2 arterial 45.2 (*)    pO2, Arterial 135.0 (*)    Bicarbonate 19.6 (*)    Acid-base deficit 8.0 (*)    All other components within normal limits  POCT I-STAT, CHEM 8 - Abnormal; Notable for the following:    Potassium 3.4 (*)    BUN 22 (*)    Creatinine, Ser 1.20 (*)    Glucose, Bld 210 (*)    Calcium, Ion 1.11 (*)    Hemoglobin 11.9 (*)    HCT 35.0 (*)    All other components within normal limits  POCT I-STAT 3, ART BLOOD GAS (G3+) - Abnormal; Notable for the following:    pH, Arterial 7.336 (*)    pO2, Arterial 295.0 (*)    Acid-base deficit 5.0 (*)    All other components within normal limits  CULTURE, BLOOD (ROUTINE X 2)  CULTURE, BLOOD (ROUTINE X 2)  URINE CULTURE  MRSA PCR SCREENING  MAGNESIUM  APTT  TROPONIN I  PROTIME-INR  APTT  BLOOD GAS, ARTERIAL  BLOOD GAS, ARTERIAL  PROTIME-INR  POCT ACTIVATED CLOTTING TIME    Imaging Review Ct Head Wo Contrast  03/07/2015   CLINICAL DATA:  Post CPR and cardiac arrest, on ventilator  EXAM: CT HEAD WITHOUT CONTRAST  TECHNIQUE: Contiguous axial images were obtained from the base of the skull through the vertex without intravenous contrast.  COMPARISON:  None.  FINDINGS: No skull fracture is noted. No intracranial hemorrhage, mass effect or midline shift. Secretions are noted in nasopharynx.  No hydrocephalus. No intra or  extra-axial fluid collection. No acute cortical infarction. No intraventricular hemorrhage. No mass lesion is noted on this unenhanced scan.  IMPRESSION: No  intracranial hemorrhage, mass effect or midline shift. No definite acute cortical infarction. No mass lesion is noted on this unenhanced scan.   Electronically Signed   By: Natasha Mead M.D.   On: 04-19-2015 11:37   Dg Chest Portable 1 View  04/19/2015   ADDENDUM REPORT: 19-Apr-2015 11:45  ADDENDUM: There is a left subclavian central line with tip in SVC right atrium junction. No pneumothorax.   Electronically Signed   By: Natasha Mead M.D.   On: 04/19/2015 11:45   Apr 19, 2015   CLINICAL DATA:  Cold STEMI  EXAM: PORTABLE CHEST - 1 VIEW  COMPARISON:  None.  FINDINGS: Cardiomegaly. Endotracheal tube with tip 4.1 cm above the carina. No acute infiltrate or pulmonary edema. Mild left basilar atelectasis. No pneumothorax.  IMPRESSION: Endotracheal tube with tip 4 cm above the carina. No pneumothorax. Cardiomegaly. Left basilar atelectasis.  Electronically Signed: By: Natasha Mead M.D. On: 04-19-15 11:31     EKG Interpretation   Date/Time:  Thursday 19-Apr-2015 10:32:40 EDT Ventricular Rate:  99 PR Interval:  177 QRS Duration: 110 QT Interval:  390 QTC Calculation: 500 R Axis:   28 Text Interpretation:  Sinus rhythm Probable left atrial enlargement Left  ventricular hypertrophy Inferior infarct, acute (LCx) Lateral leads are  also involved Prolonged QT interval Confirmed by Rubin Payor  MD, Harrold Donath  [phone]) on 2015-04-19 4:27:56 PM      MDM   Final diagnoses:  Cardiac arrest  ST elevation myocardial infarction (STEMI), unspecified artery  Ventricular fibrillation    Patient presented post cardiac arrest. Return of vitals. 24 minutes of downtime. Code cool called then code STEMI called since she had severe ST elevation. Somewhat hypotensive in the ER. Intubation and central line done by pulmonary critical care and then taken to cath lab. Some  delay for Cath Lab for line placement and head CT.  CRITICAL CARE Performed by: Billee Cashing Total critical care time:30 Critical care time was exclusive of separately billable procedures and treating other patients. Critical care was necessary to treat or prevent imminent or life-threatening deterioration. Critical care was time spent personally by me on the following activities: development of treatment plan with patient and/or surrogate as well as nursing, discussions with consultants, evaluation of patient's response to treatment, examination of patient, obtaining history from patient or surrogate, ordering and performing treatments and interventions, ordering and review of laboratory studies, ordering and review of radiographic studies, pulse oximetry and re-evaluation of patient's condition.    Benjiman Core, MD 04-19-2015 [phone]

## 2015-03-24 NOTE — Consult Note (Signed)
CARDIOLOGY CONSULT NOTE  Patient ID: Anna Kemp, MRN: [id number], DOB/AGE: 1965/10/20 49 y.o. Admit date: Mar 30, 2015 Date of Consult: 30-Mar-2015  Primary Physician: No primary care provider on file. Primary Cardiologist: none Referring Physician: Dr Molli Knock  Chief Complaint: Out-of-hospital arrest/STEMI Reason for Consultation: same  HPI:  49 year old woman with history of congestive heart failure. Specifics of her cardiac history are not well known. She was at home this morning with her 63 year old daughter and she was complaining of her chest burning. She was drinking orange juice in order to relieve the discomfort. She's had increasing shortness of breath recently and has been taking her diarrhetic. She is on no other medications. She has been seen by a cardiologist and [facility] but has not had any recent follow-up because of financial difficulty. She suddenly fell to the ground and became unresponsive. EMS was called and her daughter perform CPR.  Upon EMS arrival she was in ventricular fibrillation and she required multiple defibrillations. There was a period of PEA. Time to return of spontaneous circulation was  Approximately 25 minutes.  Her post-resuscitation EKG shows marked inferolateral ST segment elevation consistent with acute ST elevation MI. The patient is unconscious and intubated at the time of my evaluation.  Medical History:  Past Medical History  Diagnosis Date  . CHF (congestive heart failure)       Surgical History: History reviewed. No pertinent past surgical history.   Home Meds: Prior to Admission medications   Medication Sig Start Date End Date Taking? Authorizing Provider  bumetanide (BUMEX) 1 MG tablet Take 1 mg by mouth daily. 03/14/15   Historical Provider, MD  pantoprazole (PROTONIX) 40 MG tablet Take 40 mg by mouth daily. 01/03/15   Historical Provider, MD    Inpatient Medications:  . sodium chloride  2,000 mL Intravenous Once  . sodium chloride  2,000  mL Intravenous Once  . [MAR Hold] artificial tears  1 application Both Eyes 3 times per day  . aspirin  300 mg Rectal NOW  . [MAR Hold] cisatracurium  0.1 mg/kg Intravenous Once  . fentaNYL (SUBLIMAZE) injection  100 mcg Intravenous Once  . lidocaine (cardiac) 100 mg/66ml      . [MAR Hold] pantoprazole (PROTONIX) IV  40 mg Intravenous QHS   . [MAR Hold] cisatracurium (NIMBEX) infusion    . fentaNYL infusion INTRAVENOUS    . midazolam (VERSED) infusion    . [MAR Hold] norepinephrine (LEVOPHED) Adult infusion      Allergies: Allergies not on file  History   Social History  . Marital Status: Unknown    Spouse Name: N/A  . Number of Children: N/A  . Years of Education: N/A   Occupational History  . Not on file.   Social History Main Topics  . Smoking status: Current Every Day Smoker  . Smokeless tobacco: Not on file  . Alcohol Use: Not on file  . Drug Use: Not on file  . Sexual Activity: Not on file   Other Topics Concern  . Not on file   Social History Narrative    The patient lives with her daughter and [name].     Family History  Problem Relation Age of Onset  . Congestive Heart Failure Mother   . Coronary artery disease Mother      Review of Systems:  unable to obtain  Physical Exam: Blood pressure 79/58, pulse 105, temperature 98.3 F (36.8 C), temperature source Rectal, resp. rate 31, height 5\' 7"  (1.702 m), weight 180  lb (81.647 kg), last menstrual period 04-09-15, SpO2 100 %. Pt is  Intubated and unresponsive HEENT: normal. No signs of head trauma Neck:  Neck collar in place Lungs: equal expansion, clear bilaterally CV: Apex is discrete and nondisplaced, RRR without murmur or gallop Abd: soft,  No masses, bowel sounds are present Back: no  Obvious deformity Ext: no C/C/E Skin: warm and dry without rash Neuro: CNII-XII intact             Strength intact = bilaterally   Labs: No results for input(s): CKTOTAL, CKMB, TROPONINI in the  last 72 hours. Lab Results  Component Value Date   WBC 30.3* 2015-04-09   HGB 11.6* 04-09-15   HCT 34.0* 04-09-15   MCV 70.8* 04-09-15   PLT 268 04/09/2015     Recent Labs Lab 04/09/2015 1037 04-09-15 1047  NA 138 141  K 3.1* 3.4*  CL 105 106  CO2 18*  --   BUN 18 22*  CREATININE 1.36* 1.30*  CALCIUM 7.6*  --   PROT 6.1*  --   BILITOT 1.0  --   ALKPHOS 52  --   ALT 55*  --   AST 84*  --   GLUCOSE 265* 263*   No results found for: CHOL, HDL, LDLCALC, TRIG No results found for: DDIMER  Radiology/Studies:  No results found.  EKG:  Sinus rhythm with acute inferolateral ST segment elevation MI  Cardiac Studies: Pending   chest x-ray official interpretation is pending: Images reviewed and there is marked cardiomegaly and diffuse interstitial edema present  ASSESSMENT AND PLAN:  1. Out of hospital ventricular fibrillation cardiac arrest 2. Acute inferolateral ST segment elevation MI 3. Chronic congestive heart failure, specifics unknown  4. Medical noncompliance / limited resources 5. Tobacco abuse 6. Lactic acidosis , secondary to #1  7. Acute kidney injury , secondary to #1   emergency cardiac catheterization and probable PCI. Emergency  implied consent obtained.  Reviewed plan with the patient's daughter who understands and agrees to proceed.  Enzo Bi MD, [facility] Apr 09, 2015, 11:24 AM

## 2015-03-24 NOTE — Progress Notes (Signed)
Responded to trauma page  to provide support to patient and staff. Patient per EMS was a witnessed arrest.  Patient going cab lab.  Patient daughter presence and was escorted to consultation room to speak with  Cath lab doctor. Will follow as needed.

## 2015-03-24 NOTE — Care Management Note (Signed)
Case Management Note  Patient Details  Name: Anna Kemp MRN: [id number] Date of Birth: 1966/02/05  Subjective/Objective:      Adm w vfib arrest, vent              Action/Plan: lives at home   Expected Discharge Date:                  Expected Discharge Plan:     In-House Referral:     Discharge planning Services     Post Acute Care Choice:    Choice offered to:     DME Arranged:    DME Agency:     HH Arranged:    HH Agency:     Status of Service:     Medicare Important Message Given:    Date Medicare IM Given:    Medicare IM give by:    Date Additional Medicare IM Given:    Additional Medicare Important Message give by:     If discussed at Long Length of Stay Meetings, dates discussed:    Additional Comments: ur review done.  Hanley Hays, RN 03/03/2015, 2:23 PM

## 2015-03-24 NOTE — Progress Notes (Signed)
Bedside EEG completed, results pending. 

## 2015-03-24 NOTE — ED Notes (Signed)
Per [facility], pt was witnessed arrest this morning by family. Pt had felt nauseated and had some heartburn, went to get some orange juice and collapsed. Fire arrived had VF on monitor and shocked 2x. EMS arrived and had VF on monitor and pt given total of 2 epi and given 4 more shocks. Has a 20g to her left wrist and 20 g to her right foot. Given 150 mg bolus of amio and started on a 1:1 gtt of amio. 100mg :100mg 

## 2015-03-24 NOTE — Progress Notes (Addendum)
ANTICOAGULATION CONSULT NOTE - Initial Consult  Pharmacy Consult for heparin Indication: chest pain/ACS  Allergies not on file  Patient Measurements: Height: 5\' 7"  (170.2 cm) Weight: 180 lb (81.647 kg) IBW/kg (Calculated) : 61.6 Heparin Dosing Weight: 78kg  Vital Signs: Temp: 98.3 F (36.8 C) (06/30 1110) Temp Source: Rectal (06/30 1110) BP: 110/88 mmHg (06/30 1120) Pulse Rate: 105 (06/30 1100)  Labs:  Recent Labs  02/23/2015 1037 03/02/2015 1047  HGB 8.9* 11.6*  HCT 32.7* 34.0*  PLT 268  --   APTT 26  --   LABPROT 17.4*  --   INR 1.41  --   CREATININE 1.36* 1.30*    Estimated Creatinine Clearance: 59.4 mL/min (by C-G formula based on Cr of 1.3).   Medical History: Past Medical History  Diagnosis Date  . CHF (congestive heart failure)       Assessment: 49 yo female s/p VF arrest and CODE STEMI to begin heparin 2 hours post TR band removal. He is also on the hypothermia protocol. Hg/hct= 11.6/34, plt= 268, and INR= 1.41.  Goal of Therapy:  Heparin level 0.3-0.7 units/ml Monitor platelets by anticoagulation protocol: Yes   Plan:  -Start heparin at 650 units/hr 2 hours post TR band removal -Heparin level in 6 hours and daily wth CBC daily  Harland German, Pharm D 03/14/2015 12:29 PM   Addendum: TR band removed ~ 1745 PM, will start heparin at 1945 PM.  Tad Moore, BCPS  Clinical Pharmacist Pager [phone]  03/16/2015 5:47 PM

## 2015-03-24 NOTE — Progress Notes (Signed)
Elink: 4:11 PM @ 03/06/2015   eLink Physician Progress Note and Electrolyte Replacement  Patient Name: Anna Kemp DOB: Nov 17, 1965 MRN: [id number]  Date of Service  03/02/2015   HPI/Events of Note    Recent Labs Lab 03/12/2015 1037 03/16/2015 1047 03/17/2015 1434 03/23/2015 1435  NA 138 141 141 139  K 3.1* 3.4* 3.4* 3.4*  CL 105 106 104 109  CO2 18*  --   --  22  GLUCOSE 265* 263* 210* 220*  BUN 18 22* 22* 22*  CREATININE 1.36* 1.30* 1.20* 1.40*  CALCIUM 7.6*  --   --  7.6*  MG 2.0  --   --   --     Estimated Creatinine Clearance: 55.2 mL/min (by C-G formula based on Cr of 1.4).  Intake/Output      06/29 0701 - 06/30 0700 06/30 0701 - 07/01 0700   I.V. (mL/kg)  44.3 (0.5)   Total Intake(mL/kg)  44.3 (0.5)   Urine (mL/kg/hr)  225   Emesis/NG output  300   Total Output   525   Net   -480.7         - I/O DETAILED x 24h    Total I/O In: 44.3 [I.V.:44.3] Out: 525 [Urine:225; Emesis/NG output:300] - I/O THIS SHIFT    ASSESSMENT Low K Rising creat but making urine  eICURN Interventions  replet K Check mag and phos 03/25/15    ASSESSMENT: MAJOR ELECTROLYTE      Dr. Kalman Shan, M.D., [facility].C.P Pulmonary and Critical Care Medicine Staff Physician [facility] and Critical Care Pager: [phone], If no answer or between  15:00h - 7:00h: call [phone]  03/08/2015 4:11 PM

## 2015-03-24 NOTE — Progress Notes (Signed)
Aggie Cosier RN informed in report by neely rn that these drips were not infusing and that patient did not have on cooling pads.

## 2015-03-24 NOTE — H&P (Signed)
PULMONARY / CRITICAL CARE MEDICINE   Name: Anna Kemp MRN: [id number] DOB: 1965-09-28    ADMISSION DATE:  03/23/2015  REFERRING MD :  EDP  CHIEF COMPLAINT:  Post arrest   INITIAL PRESENTATION: 49yo female with ?hx cardiomyopathy presented 6/30 after VFib arrest.  Approx 25 minutes CPR.  Evaluated in ER and code STEMI called, pt taken emergently to cath lab.  PCCM called to admit for hypothermia protocol.   STUDIES:  CT head 6/30>>> Cath 6/30>>>  SIGNIFICANT EVENTS:    HISTORY OF PRESENT ILLNESS:  49yo female with ?hx cardiomyopathy presented 6/30 after VFib arrest.  Witnessed arrest by family.  Pt c/o nausea and heartburn, went to get some juice and collapsed.  Approx 25 minutes CPR.  Evaluated in ER and code STEMI called, pt taken emergently to cath lab.  PCCM called to admit for hypothermia protocol.    PAST MEDICAL HISTORY :   has a past medical history of CHF (congestive heart failure).  has no past surgical history on file. Prior to Admission medications   Medication Sig Start Date End Date Taking? Authorizing Provider  bumetanide (BUMEX) 1 MG tablet Take 1 mg by mouth daily. 03/14/15   Historical Provider, MD  pantoprazole (PROTONIX) 40 MG tablet Take 40 mg by mouth daily. 01/03/15   Historical Provider, MD   Allergies not on file  FAMILY HISTORY:  has no family status information on file.  SOCIAL HISTORY:  reports that she has been smoking.  She does not have any smokeless tobacco history on file.  REVIEW OF SYSTEMS:  Unable.  As Per HPI.   SUBJECTIVE:   VITAL SIGNS: Temp:  [98.3 F (36.8 C)] 98.3 F (36.8 C) (06/30 1110) Pulse Rate:  [99-105] 105 (06/30 1100) Resp:  [14-31] 31 (06/30 1100) BP: (79-109)/(57-84) 79/58 mmHg (06/30 1100) SpO2:  [100 %] 100 % (06/30 1100) FiO2 (%):  [100 %] 100 % (06/30 1051) Weight:  [180 lb (81.647 kg)] 180 lb (81.647 kg) (06/30 1042) HEMODYNAMICS:   VENTILATOR SETTINGS: Vent Mode:  [-] PRVC FiO2 (%):  [100 %] 100 % Set  Rate:  [14 bmp] 14 bmp Vt Set:  [500 mL] 500 mL PEEP:  [5 cmH20] 5 cmH20 Plateau Pressure:  [22 cmH20] 22 cmH20 INTAKE / OUTPUT: No intake or output data in the 24 hours ending 02/23/2015 1139  PHYSICAL EXAMINATION: General:  wdwn female, critically ill post arrest  Neuro:  Unresponsive post arrest, myclonus noted.  HEENT:  Mm moist, ETT, pupils non reactive, pinpoint  Cardiovascular:  s1s2 rrr Lungs:  resps even non labored on vent, diminished bases  Abdomen:  Soft, +bs  Musculoskeletal:  Warm and dry, no edema    LABS:  CBC  Recent Labs Lab 02/23/2015 1037 03/15/2015 1047  WBC 30.3*  --   HGB 8.9* 11.6*  HCT 32.7* 34.0*  PLT 268  --    Coag's  Recent Labs Lab 03/08/2015 1037  APTT 26  INR 1.41   BMET  Recent Labs Lab 02/23/2015 1037 03/16/2015 1047  NA 138 141  K 3.1* 3.4*  CL 105 106  CO2 18*  --   BUN 18 22*  CREATININE 1.36* 1.30*  GLUCOSE 265* 263*   Electrolytes  Recent Labs Lab 03/18/2015 1037  CALCIUM 7.6*  MG 2.0   Sepsis Markers  Recent Labs Lab 03/04/2015 1047  LATICACIDVEN 6.15*   ABG  Recent Labs Lab 03/09/2015 1128  PHART 7.243*  PCO2ART 45.2*  PO2ART 135.0*   Liver Enzymes  Recent Labs Lab 2015/04/12 1037  AST 84*  ALT 55*  ALKPHOS 52  BILITOT 1.0  ALBUMIN 3.0*   Cardiac Enzymes No results for input(s): TROPONINI, PROBNP in the last 168 hours. Glucose No results for input(s): GLUCAP in the last 168 hours.  Imaging Dg Chest Portable 1 View  04/12/15   CLINICAL DATA:  Cold STEMI  EXAM: PORTABLE CHEST - 1 VIEW  COMPARISON:  None.  FINDINGS: Cardiomegaly. Endotracheal tube with tip 4.1 cm above the carina. No acute infiltrate or pulmonary edema. Mild left basilar atelectasis. No pneumothorax.  IMPRESSION: Endotracheal tube with tip 4 cm above the carina. No pneumothorax. Cardiomegaly. Left basilar atelectasis.   Electronically Signed   By: Natasha Mead M.D.   On: 04/12/15 11:31     ASSESSMENT / PLAN:  PULMONARY OETT  6/30>>> Acute respiratory failure - post cardiac arrest  P:   Vent support - 8cc/kg  F/u CXR  F/u ABG   CARDIOVASCULAR CVL L [place] 6/30>>> Cardiac arrest  ?Hx cardiomyopathy  STEMI P:  To cath lab per cards  F/u troponin  F/u echo  Heparin per cards unless other rx per cards  Hypothermia protocol    RENAL AKI -unknown baseline  Hypokalemia  P:   Frequent chem per hypokalemia protocol   GASTROINTESTINAL No active issue  P:   NPO   HEMATOLOGIC Leukocytosis  Anemia - mild  P:  F/u cbc  Heparin as above   INFECTIOUS No obvious source infection  P:   BCx2 6/30>>> UC 6/30>>> Sputum 6/30>>   ENDOCRINE Hyperglycemia - ?hx DM  P:   HgbA1c  ICU hyperglycemia protocol   NEUROLOGIC AMS - post arrest  P:   RASS goal: -5 Hypothermia protocol as above    FAMILY  - Updates:  Family updated at bedside in ER by Dr. Excell Seltzer   - Inter-disciplinary family meet or Palliative Care meeting due by:  7/6     Dirk Dress, NP 04-12-2015  11:39 AM Pager: [phone] or [phone]  Attending note:  49 year old female with history of non-compliance with medications, history of cardiomyopathy, witnessed cardiac arrest, posturing on exam, STEMI on EKG.  Will perform head CT, if negative then proceed with hypothermia.  To cath lab for cath.  PCCM will continue to follow.  Poor prognosis, no family bedside to update.  The patient is critically ill with multiple organ systems failure and requires high complexity decision making for assessment and support, frequent evaluation and titration of therapies, application of advanced monitoring technologies and extensive interpretation of multiple databases.   Critical Care Time devoted to patient care services described in this note is  35  Minutes. This time reflects time of care of this signee Dr Koren Bound. This critical care time does not reflect procedure time, or teaching time or supervisory time of PA/NP/Med  student/Med Resident etc but could involve care discussion time.  Alyson Reedy, M.D. [facility] Pulmonary/Critical Care Medicine. Pager: [phone]. After hours pager: [phone].

## 2015-03-24 NOTE — Progress Notes (Signed)
Aline attempted x's 2  By Daron Offer RRT. Unable to thread aline. Pt had some bleeding at site,held until stopped bleeding. Bandage placed on site. Dr. Marchelle Gearing notified & said to DC arterial line order.

## 2015-03-24 NOTE — Progress Notes (Signed)
Upon arrival to recovery, versed, fentanyl, levophed, and nimbex bags were available on iv pole but not infusing per DR Excell Seltzer. Patient does have cold icebags under bilateral arms and one between legs.

## 2015-03-24 NOTE — ED Notes (Signed)
Pt brought to cath lab room 9 from ct scan 2 at this time. Confirming whether pt has any bleed showing on her CT scan prior to giving ASA suppository.

## 2015-03-24 NOTE — Interval H&P Note (Signed)
History and Physical Interval Note:  03/04/2015 11:29 AM  Anna Kemp  has presented today for surgery, with the diagnosis of stemi  The various methods of treatment have been discussed with the patient and family. After consideration of risks, benefits and other options for treatment, the patient has consented to  Procedure(s): Left Heart Cath and Coronary Angiography (N/A) as a surgical intervention .  The patient's history has been reviewed, patient examined, no change in status, stable for surgery.  I have reviewed the patient's chart and labs.  Questions were answered to the patient's satisfaction.     Tonny Bollman

## 2015-03-24 NOTE — Procedures (Signed)
History: 49 yo F who is s/p cardiac arrest.   Sedation: Versed  Technique: This is a 19 channel routine scalp EEG performed at the bedside with bipolar and monopolar montages arranged in accordance to the international 10/20 system of electrode placement. One channel was dedicated to EKG recording.    Background: The background is nearly isoelectric with the exception of extremely low voltage brief beta discharges seen in the left temporal region lasting 1 -2 seconds. These are only visible at a sensitivity of 2 uV and the possibility of them being artifact is impossible to rule out.  Photic stimulation: Physiologic driving is not performed  EEG Abnormalities: 1) Nearly isoelectric EEG  Clinical Interpretation: This EEG is consistent with a profound generalized cerebral dysfunction(encephalopathy) as can be seen with hypoxic encephalopathy. The presence of sedating medication and hypothermia does make this recording less definite for prognostication. There was no seizure or seizure predisposition recorded on this study.   Ritta Slot, MD [facility] [phone]  If 7pm- 7am, please page neurology on call as listed in AMION.

## 2015-03-24 NOTE — Progress Notes (Signed)
Dr. Marchelle Gearing notified of K 3.4 at 14:30 and K 18:30 of 3.4. Also informed patient took 3 hours to cool to 33 degrees. Will continue to monitor.

## 2015-03-24 NOTE — Progress Notes (Signed)
Per Dr Molli Knock, Telephone Order, Increase RR to 18 and repeat ABG in 1 hour. Order submitted.

## 2015-03-24 NOTE — Procedures (Signed)
Intubation Procedure Note Meleah Stumph [id number] [birth year]  Procedure: Intubation Indications: Airway protection and maintenance  Procedure Details Consent: Unable to obtain consent because of altered level of consciousness. Time Out: Verified patient identification, verified procedure, site/side was marked, verified correct patient position, special equipment/implants available, medications/allergies/relevent history reviewed, required imaging and test results available.  Performed  Maximum sterile technique was used including gloves, hand hygiene and mask.  MAC    Evaluation Hemodynamic Status: BP stable throughout; O2 sats: stable throughout Patient's Current Condition: stable Complications: No apparent complications Patient did tolerate procedure well. Chest X-ray ordered to verify placement.  CXR: pending.   Koren Bound 03/03/2015

## 2015-03-25 ENCOUNTER — Inpatient Hospital Stay (HOSPITAL_COMMUNITY): Payer: BLUE CROSS/BLUE SHIELD

## 2015-03-25 DIAGNOSIS — I509 Heart failure, unspecified: Secondary | ICD-10-CM

## 2015-03-25 LAB — TROPONIN I: TROPONIN I: 35.58 ng/mL — AB (ref <0.031)

## 2015-03-25 LAB — BASIC METABOLIC PANEL
ANION GAP: 8 (ref 5–15)
Anion gap: 10 (ref 5–15)
Anion gap: 8 (ref 5–15)
Anion gap: 10 (ref 5–15)
Anion gap: 10 (ref 5–15)
Anion gap: 10 (ref 5–15)
BUN: 19 mg/dL (ref 6–20)
BUN: 20 mg/dL (ref 6–20)
BUN: 20 mg/dL (ref 6–20)
BUN: 20 mg/dL (ref 6–20)
BUN: 20 mg/dL (ref 6–20)
BUN: 20 mg/dL (ref 6–20)
CALCIUM: 7.8 mg/dL — AB (ref 8.9–10.3)
CALCIUM: 8.5 mg/dL — AB (ref 8.9–10.3)
CHLORIDE: 109 mmol/L (ref 101–111)
CHLORIDE: 109 mmol/L (ref 101–111)
CHLORIDE: 109 mmol/L (ref 101–111)
CO2: 20 mmol/L — AB (ref 22–32)
CO2: 20 mmol/L — ABNORMAL LOW (ref 22–32)
CO2: 20 mmol/L — ABNORMAL LOW (ref 22–32)
CO2: 21 mmol/L — ABNORMAL LOW (ref 22–32)
CO2: 21 mmol/L — ABNORMAL LOW (ref 22–32)
CO2: 22 mmol/L (ref 22–32)
CREATININE: 0.96 mg/dL (ref 0.44–1.00)
CREATININE: 1.05 mg/dL — AB (ref 0.44–1.00)
CREATININE: 1.09 mg/dL — AB (ref 0.44–1.00)
Calcium: 8.3 mg/dL — ABNORMAL LOW (ref 8.9–10.3)
Calcium: 8.3 mg/dL — ABNORMAL LOW (ref 8.9–10.3)
Calcium: 8.4 mg/dL — ABNORMAL LOW (ref 8.9–10.3)
Calcium: 8.5 mg/dL — ABNORMAL LOW (ref 8.9–10.3)
Chloride: 111 mmol/L (ref 101–111)
Chloride: 108 mmol/L (ref 101–111)
Chloride: 112 mmol/L — ABNORMAL HIGH (ref 101–111)
Creatinine, Ser: 1.05 mg/dL — ABNORMAL HIGH (ref 0.44–1.00)
Creatinine, Ser: 0.93 mg/dL (ref 0.44–1.00)
Creatinine, Ser: 0.94 mg/dL (ref 0.44–1.00)
GFR calc Af Amer: >60 mL/min (ref >60)
GFR calc Af Amer: >60 mL/min (ref >60)
GFR calc non Af Amer: 60 mL/min — ABNORMAL LOW (ref >60)
GFR calc non Af Amer: >60 mL/min (ref >60)
GFR calc non Af Amer: >60 mL/min (ref >60)
GFR calc non Af Amer: >60 mL/min (ref >60)
GLUCOSE: 115 mg/dL — AB (ref 65–99)
Glucose, Bld: 168 mg/dL — ABNORMAL HIGH (ref 65–99)
Glucose, Bld: 137 mg/dL — ABNORMAL HIGH (ref 65–99)
Glucose, Bld: 161 mg/dL — ABNORMAL HIGH (ref 65–99)
Glucose, Bld: 149 mg/dL — ABNORMAL HIGH (ref 65–99)
Glucose, Bld: 128 mg/dL — ABNORMAL HIGH (ref 65–99)
POTASSIUM: 3.7 mmol/L (ref 3.5–5.1)
POTASSIUM: 3.8 mmol/L (ref 3.5–5.1)
POTASSIUM: 4 mmol/L (ref 3.5–5.1)
Potassium: 4.1 mmol/L (ref 3.5–5.1)
Potassium: 3.5 mmol/L (ref 3.5–5.1)
Potassium: 3.7 mmol/L (ref 3.5–5.1)
Sodium: 139 mmol/L (ref 135–145)
Sodium: 141 mmol/L (ref 135–145)
Sodium: 140 mmol/L (ref 135–145)
Sodium: 139 mmol/L (ref 135–145)
Sodium: 138 mmol/L (ref 135–145)
Sodium: 141 mmol/L (ref 135–145)

## 2015-03-25 LAB — GLUCOSE, CAPILLARY
GLUCOSE-CAPILLARY: 139 mg/dL — AB (ref 65–99)
GLUCOSE-CAPILLARY: 148 mg/dL — AB (ref 65–99)
GLUCOSE-CAPILLARY: 148 mg/dL — AB (ref 65–99)
GLUCOSE-CAPILLARY: 153 mg/dL — AB (ref 65–99)
GLUCOSE-CAPILLARY: 159 mg/dL — AB (ref 65–99)
GLUCOSE-CAPILLARY: 169 mg/dL — AB (ref 65–99)
GLUCOSE-CAPILLARY: 188 mg/dL — AB (ref 65–99)
Glucose-Capillary: 146 mg/dL — ABNORMAL HIGH (ref 65–99)
Glucose-Capillary: 147 mg/dL — ABNORMAL HIGH (ref 65–99)
Glucose-Capillary: 143 mg/dL — ABNORMAL HIGH (ref 65–99)
Glucose-Capillary: 119 mg/dL — ABNORMAL HIGH (ref 65–99)
Glucose-Capillary: 112 mg/dL — ABNORMAL HIGH (ref 65–99)

## 2015-03-25 LAB — POCT I-STAT, CHEM 8
BUN: 20 mg/dL (ref 6–20)
CHLORIDE: 107 mmol/L (ref 101–111)
Calcium, Ion: 1.1 mmol/L — ABNORMAL LOW (ref 1.12–1.23)
Creatinine, Ser: 1.1 mg/dL — ABNORMAL HIGH (ref 0.44–1.00)
Glucose, Bld: 150 mg/dL — ABNORMAL HIGH (ref 65–99)
HEMATOCRIT: 34 % — AB (ref 36.0–46.0)
Hemoglobin: 11.6 g/dL — ABNORMAL LOW (ref 12.0–15.0)
Potassium: 3.2 mmol/L — ABNORMAL LOW (ref 3.5–5.1)
Sodium: 143 mmol/L (ref 135–145)
TCO2: 19 mmol/L (ref 0–100)

## 2015-03-25 LAB — CBC
HCT: 34.2 % — ABNORMAL LOW (ref 36.0–46.0)
Hemoglobin: 9.6 g/dL — ABNORMAL LOW (ref 12.0–15.0)
MCH: 19.8 pg — AB (ref 26.0–34.0)
MCHC: 28.1 g/dL — ABNORMAL LOW (ref 30.0–36.0)
MCV: 70.7 fL — ABNORMAL LOW (ref 78.0–100.0)
PLATELETS: 291 10*3/uL (ref 150–400)
RBC: 4.84 MIL/uL (ref 3.87–5.11)
RDW: 20.2 % — ABNORMAL HIGH (ref 11.5–15.5)
WBC: 23 10*3/uL — AB (ref 4.0–10.5)

## 2015-03-25 LAB — URINE CULTURE: CULTURE: NO GROWTH

## 2015-03-25 LAB — MAGNESIUM: MAGNESIUM: 2.1 mg/dL (ref 1.7–2.4)

## 2015-03-25 LAB — HEPARIN LEVEL (UNFRACTIONATED)
HEPARIN UNFRACTIONATED: 0.31 [IU]/mL (ref 0.30–0.70)
HEPARIN UNFRACTIONATED: 0.35 [IU]/mL (ref 0.30–0.70)
Heparin Unfractionated: <0.1 IU/mL — ABNORMAL LOW (ref 0.30–0.70)
Heparin Unfractionated: 0.59 IU/mL (ref 0.30–0.70)

## 2015-03-25 LAB — PHOSPHORUS: Phosphorus: 4.2 mg/dL (ref 2.5–4.6)

## 2015-03-25 LAB — APTT: aPTT: 102 seconds — ABNORMAL HIGH (ref 24–37)

## 2015-03-25 MED ORDER — TICAGRELOR 90 MG PO TABS
90.0000 mg | ORAL_TABLET | Freq: Two times a day (BID) | ORAL | Status: DC
  Administered 2015-03-25 – 2015-03-29 (×8): 90 mg
  Filled 2015-03-25 (×9): qty 1

## 2015-03-25 MED ORDER — ASPIRIN 81 MG PO CHEW
81.0000 mg | CHEWABLE_TABLET | Freq: Every day | ORAL | Status: DC
  Administered 2015-03-26 – 2015-03-31 (×6): 81 mg
  Filled 2015-03-25 (×5): qty 1

## 2015-03-25 MED ORDER — NOREPINEPHRINE BITARTRATE 1 MG/ML IV SOLN
0.0000 ug/min | INTRAVENOUS | Status: DC
  Administered 2015-03-25: 7 ug/min via INTRAVENOUS
  Administered 2015-03-26: 20 ug/min via INTRAVENOUS
  Filled 2015-03-25 (×3): qty 16

## 2015-03-25 MED ORDER — INSULIN ASPART 100 UNIT/ML ~~LOC~~ SOLN
0.0000 [IU] | SUBCUTANEOUS | Status: DC
  Administered 2015-03-25 – 2015-03-30 (×14): 1 [IU] via SUBCUTANEOUS
  Administered 2015-03-30: 2 [IU] via SUBCUTANEOUS
  Administered 2015-03-30 – 2015-03-31 (×3): 1 [IU] via SUBCUTANEOUS

## 2015-03-25 MED ORDER — ACETAMINOPHEN 325 MG PO TABS
650.0000 mg | ORAL_TABLET | ORAL | Status: DC | PRN
  Administered 2015-03-26 – 2015-04-01 (×8): 650 mg
  Filled 2015-03-25 (×10): qty 2

## 2015-03-25 MED ORDER — SODIUM CHLORIDE 0.9 % IV BOLUS (SEPSIS)
500.0000 mL | Freq: Once | INTRAVENOUS | Status: AC
  Administered 2015-03-25: 500 mL via INTRAVENOUS

## 2015-03-25 MED FILL — Heparin Sodium (Porcine) 2 Unit/ML in Sodium Chloride 0.9%: INTRAMUSCULAR | Qty: 1000 | Status: AC

## 2015-03-25 MED FILL — Lidocaine HCl Local Preservative Free (PF) Inj 1%: INTRAMUSCULAR | Qty: 30 | Status: AC

## 2015-03-25 NOTE — Progress Notes (Signed)
EKG CRITICAL VALUE     12 lead EKG performed.  Critical value noted.  Bronson Curb, RN notified.   Marjie Chea C, CCT 03/25/2015 7:22 AM

## 2015-03-25 NOTE — Progress Notes (Signed)
Initial Nutrition Assessment   INTERVENTION:  Recommend once pt is rewarmed and if pt remains on the vent order the Adult TF Protocol.   Vital AF 1.2 @ 60 ml/hr would provide 1728 kcal, 108 grams protein, and 1167 ml H2O  NUTRITION DIAGNOSIS:  Inadequate oral intake related to inability to eat as evidenced by NPO status.   GOAL:  Patient will meet greater than or equal to 90% of their needs   MONITOR:  Vent status, Labs, Weight trends  REASON FOR ASSESSMENT:  Ventilator    ASSESSMENT:  Pt with hx of CHF possible non-compliance (limited resources) with medications admitted after vfib arrest.  Pt om hypothermia protocol, cooled 1730 6/30.  Patient is currently intubated on ventilator support MV: 9.2 L/min Temp (24hrs), Avg:92 F (33.3 C), Min:89.1 F (31.7 C), Max:96.4 F (35.8 C)  Pt with OG tube.  Medications and Labs reviewed: cbg: 20-148 Spoke with daughter at bedside, no recent weight loss and good appetite PTA.   Nutrition-Focused physical exam completed. Findings are no fat depletion, no muscle depletion, and no edema.    Height:  Ht Readings from Last 1 Encounters:  03/18/2015 5\' 7"  (1.702 m)    Weight:  Wt Readings from Last 1 Encounters:  03/20/2015 180 lb (81.647 kg)    Ideal Body Weight:  61.3 kg  Wt Readings from Last 10 Encounters:  03/01/2015 180 lb (81.647 kg)    BMI:  Body mass index is 28.19 kg/(m^2).  Estimated Nutritional Needs:  Kcal:  1684  Protein:  100-120  Fluid:  >/= 1.5 L/day  Skin:  Reviewed, no issues  Diet Order:    NPO  EDUCATION NEEDS:  No education needs identified at this time   Intake/Output Summary (Last 24 hours) at 03/25/15 1241 Last data filed at 03/25/15 1200  Gross per 24 hour  Intake 1357.79 ml  Output   1670 ml  Net -312.21 ml    Last BM:  PTA  Kendell Bane RD, LDN, CNSC [phone] Pager [phone] After Hours Pager

## 2015-03-25 NOTE — Progress Notes (Signed)
ANTICOAGULATION CONSULT NOTE - Follow Up Consult  Pharmacy Consult for heparin Indication: s/p arrest 2/2 STEMI   Labs:  Recent Labs  March 31, 2015 1037 31-Mar-2015 1047 2015-03-31 1434 2015/03/31 1435 03-31-2015 1839 March 31, 2015 1842 03/31/15 2030 2015/03/31 2225 03/25/15 0015 03/25/15 0145  HGB 8.9* 11.6* 11.9*  --   --  12.2  --   --   --   --   HCT 32.7* 34.0* 35.0*  --   --  36.0  --   --   --   --   PLT 268  --   --   --  289  --   --   --   --   --   APTT 26  --   --   --  36  --   --   --   --   --   LABPROT 17.4*  --   --   --  19.1*  --   --  22.1*  --   --   INR 1.41  --   --   --  1.60*  --   --  1.95*  --   --   HEPARINUNFRC  --   --   --   --   --   --   --   --   --  <0.10*  CREATININE 1.36* 1.30* 1.20* 1.40*  --  1.10* 1.14*  --  1.09*  --   TROPONINI  --   --   --  18.61*  --   --   --  40.99*  --   --     Assessment: 49yo female undetectable on heparin with initial dosing for STEMI, now on hypothermia protocol.  Goal of Therapy:  Heparin level 0.3-0.7 units/ml   Plan:  Will increase heparin gtt by 3 units/kg/hr to 900 units/hr and check level in 6hr.  Vernard Gambles, PharmD, BCPS  03/25/2015,3:04 AM

## 2015-03-25 NOTE — Progress Notes (Signed)
PULMONARY / CRITICAL CARE MEDICINE   Name: Anna Kemp MRN: [id number] DOB: 02/15/1966    ADMISSION DATE:  03/30/15  REFERRING MD :  EDP  CHIEF COMPLAINT:  Post arrest   INITIAL PRESENTATION:  49yo female with hx cardiomyopathy presented 6/30 after VFib arrest.  Approx 25 minutes CPR.  Evaluated in ER and code STEMI called, pt taken emergently to cath lab.  PCCM called to admit for hypothermia protocol.   MAJOR EVENTS/TEST RESULTS: 6/30 Admitted to CCU after prolonged cardiac arrest. Hypothermia protocol initiated 6/30 CT head: NAICP 6/30 LHC: Normal coronary arteries except for thrombotic apical occlusion of LAD. Very severe global LV systolic dysfunction with LVEF 10%. Unsuccessful PCI of the apical LAD with attempted aspiration thrombectomy and PTCA. 7/01 EEG: consistent with a profound generalized cerebral dysfunction(encephalopathy) as can be seen with hypoxic encephalopathy. The presence of sedating medication and hypothermia does make this recording less definite for prognostication. There was no seizure or seizure predisposition recorded on this study 7/01 TTE: LVEF 15%. Grade 2 DD  INDWELLING DEVICES:: ETT 6/30 >>  Anna Kemp CVL 6/30 >>   MICRO DATA: Urine 6/30 >>  Blood 6/30 >>   ANTIMICROBIALS:     SUBJECTIVE:  Rewarms tonight @ 5:30 PM  VITAL SIGNS: Temp:  [89.1 F (31.7 C)-93.9 F (34.4 C)] 91 F (32.8 C) (07/01 1600) Pulse Rate:  [58-87] 64 (07/01 1600) Resp:  [0-21] 18 (07/01 1600) BP: (84-132)/(62-105) 110/79 mmHg (07/01 1630) SpO2:  [98 %-100 %] 99 % (07/01 1600) FiO2 (%):  [40 %-50 %] 40 % (07/01 1603) HEMODYNAMICS: CVP:  [11 mmHg-17 mmHg] 11 mmHg VENTILATOR SETTINGS: Vent Mode:  [-] PRVC FiO2 (%):  [40 %-50 %] 40 % Set Rate:  [18 bmp] 18 bmp Vt Set:  [500 mL] 500 mL PEEP:  [5 cmH20] 5 cmH20 Plateau Pressure:  [19 cmH20-26 cmH20] 26 cmH20 INTAKE / OUTPUT:  Intake/Output Summary (Last 24 hours) at 03/25/15 1637 Last data filed at 03/25/15  1500  Gross per 24 hour  Intake 1410.81 ml  Output   1310 ml  Net 100.81 ml    PHYSICAL EXAMINATION: General: intubated, sedated, paralyzed  Neuro: limited exam due to NMB, PERRL HEENT: NCAT Cardiovascular: muffled HS, no M noted, regular Lungs: no wheezes Abdomen:  Soft, diminished BS Ext: cool, no edema    LABS:  CBC  Recent Labs Lab 03-30-15 1037  March 30, 2015 1638 Mar 30, 2015 1839 03/30/2015 1842 03/25/15 0400  WBC 30.3*  --   --   --   --  23.0*  HGB 8.9*  < > 11.6*  --  12.2 9.6*  HCT 32.7*  < > 34.0*  --  36.0 34.2*  PLT 268  --   --  289  --  291  < > = values in this interval not displayed. Coag's  Recent Labs Lab 30-Mar-2015 1037 2015-03-30 1839 2015-03-30 2225  APTT 26 36  --   INR 1.41 1.60* 1.95*   BMET  Recent Labs Lab 03/25/15 0400 03/25/15 0730 03/25/15 1200  NA 141 140 139  K 4.1 4.0 3.7  CL 111 109 109  CO2 22 21* 20*  BUN 20 20 20   CREATININE 1.05* 1.05* 0.96  GLUCOSE 137* 161* 149*   Electrolytes  Recent Labs Lab Mar 30, 2015 1037  03/25/15 0400 03/25/15 0730 03/25/15 1200  CALCIUM 7.6*  < > 8.3* 8.3* 8.5*  MG 2.0  --  2.1  --   --   PHOS  --   --  4.2  --   --   < > =  values in this interval not displayed. Sepsis Markers  Recent Labs Lab 02/25/2015 1047  LATICACIDVEN 6.15*   ABG  Recent Labs Lab 03/23/2015 1128 03/13/2015 1545  PHART 7.243* 7.336*  PCO2ART 45.2* 38.4  PO2ART 135.0* 295.0*   Liver Enzymes  Recent Labs Lab 02/24/2015 1037  AST 84*  ALT 55*  ALKPHOS 52  BILITOT 1.0  ALBUMIN 3.0*   Cardiac Enzymes  Recent Labs Lab 03/07/2015 1435 03/22/2015 2225 03/25/15 0400  TROPONINI 18.61* 40.99* 35.58*   Glucose  Recent Labs Lab 03/25/15 0400 03/25/15 0600 03/25/15 0725 03/25/15 0937 03/25/15 1149 03/25/15 1355  GLUCAP 153* 146* 147* 148* 139* 143*    CXR: 6/30 mild CM and edema pattern  ASSESSMENT / PLAN:  PULMONARY Acute respiratory failure post cardiac arrest  P:   Cont full vent support - settings  reviewed and/or adjusted Cont vent bundle Daily SBT if/when meets criteria   CARDIOVASCULAR Cardiac arrest  Severe cardiomyopathy STEMI Cardiogenic shock P:  Cont vasopressors to maintain MAP > 80 mmHg while hypothermic Change MAP goal to 65 mmHg after rewarmed CVP goal 10-14 Cards following  RENAL Recurrent hypokalemia due to hypothermia P:   Monitor BMET intermittently Monitor I/Os Correct electrolytes as indicated  GASTROINTESTINAL No active issue  P:   SUP: IV PPI Consider TFs once rewarmed  HEMATOLOGIC Anemia, no overt bleding noted  P:  DVT px: full dose heparin Monitor CBC intermittently Transfuse per usual ICU guidelines  INFECTIOUS No overt infection  P:   Monitor temp, WBC count Micro and abx as above  ENDOCRINE Hyperglycemia  P:   Cont SSI  NEUROLOGIC 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  After 5:30 PM or weekends, call [phone]

## 2015-03-25 NOTE — Progress Notes (Signed)
*  PRELIMINARY RESULTS* Echocardiogram 2D Echocardiogram has been performed.  Jeryl Columbia 03/25/2015, 9:35 AM

## 2015-03-25 NOTE — Progress Notes (Signed)
CCM MD made aware of Pt's decrease urine output. Pt has put out 35cc over two hours. Orders for a 500cc bolus given.

## 2015-03-25 NOTE — Progress Notes (Signed)
Left 30day free brilinta card in room. No ins listed. Left brilinta pt assist form on shadow chart.

## 2015-03-25 NOTE — Progress Notes (Signed)
ANTICOAGULATION CONSULT NOTE - Follow Up Consult  Pharmacy Consult for heparin Indication: chest pain/ACS  Not on File  Patient Measurements: Height: 5\' 7"  (170.2 cm) Weight: 180 lb (81.647 kg) IBW/kg (Calculated) : 61.6 Heparin Dosing Weight: 78kg  Vital Signs: Temp: 91.9 F (33.3 C) (07/01 1000) Temp Source: Core (Comment) (07/01 1000) BP: 110/78 mmHg (07/01 1000) Pulse Rate: 79 (07/01 0800)  Labs:  Recent Labs  04-16-15 1037  2015-04-16 1434 04/16/2015 1435 04/16/15 1839 04/16/15 1842  2015-04-16 2225 03/25/15 0015 03/25/15 0145 03/25/15 0400 03/25/15 0730 03/25/15 0930  HGB 8.9*  < > 11.9*  --   --  12.2  --   --   --   --  9.6*  --   --   HCT 32.7*  < > 35.0*  --   --  36.0  --   --   --   --  34.2*  --   --   PLT 268  --   --   --  289  --   --   --   --   --  291  --   --   APTT 26  --   --   --  36  --   --   --   --   --   --   --   --   LABPROT 17.4*  --   --   --  19.1*  --   --  22.1*  --   --   --   --   --   INR 1.41  --   --   --  1.60*  --   --  1.95*  --   --   --   --   --   HEPARINUNFRC  --   --   --   --   --   --   --   --   --  <0.10*  --   --  0.31  CREATININE 1.36*  < > 1.20* 1.40*  --  1.10*  < >  --  1.09*  --  1.05* 1.05*  --   TROPONINI  --   --   --  18.61*  --   --   --  40.99*  --   --  35.58*  --   --   < > = values in this interval not displayed.  Estimated Creatinine Clearance: 73.6 mL/min (by C-G formula based on Cr of 1.05).   Medications:  Scheduled:  . antiseptic oral rinse  7 mL Mouth Rinse QID  . artificial tears  1 application Both Eyes 3 times per day  . aspirin  81 mg Oral Daily  . chlorhexidine  15 mL Mouth Rinse BID  . fentaNYL (SUBLIMAZE) injection  100 mcg Intravenous Once  . insulin aspart  2-6 Units Subcutaneous 6 times per day  . pantoprazole (PROTONIX) IV  40 mg Intravenous QHS  . sodium chloride  3 mL Intravenous Q12H  . sodium chloride  3 mL Intravenous Q12H  . ticagrelor  90 mg Oral BID    Assessment: 49  yo female s/p VF arrest and CODE STEMI on hypothermia protocol. She is on heparin at 900 units/hr and noted at goal (HL= 0.31) -rewarming time  Goal of Therapy:  Heparin level 0.3-0.7 units/ml Monitor platelets by anticoagulation protocol: Yes   Plan:  -Increase heparin to 1000 units/hr to keep in goal -Heparin level in 6 hours and daily wth CBC  daily  Harland German, Pharm D 03/25/2015 10:32 AM

## 2015-03-25 NOTE — Progress Notes (Signed)
   eLink Physician-Brief Progress Note Patient Name: Anna Kemp DOB: 17-Oct-1965 MRN: [id number]  Date of Service  03/25/2015   HPI/Events of Note  RN says falling  Urine output = from 30cc/h -> 15cc/h. Ef 15%  eICU Interventions  500cc saline bolus   Intervention Category Minor Interventions: Electrolytes abnormality - evaluation and management  Shamina Etheridge 03/25/2015, 7:22 PM

## 2015-03-25 NOTE — Progress Notes (Addendum)
ANTICOAGULATION CONSULT NOTE - Follow Up Consult  Pharmacy Consult for heparin Indication: chest pain/ACS  Not on File  Patient Measurements: Height:  (170.2 cm) Weight: 180 lb (81.647 kg) IBW/kg (Calculated) : 61.6 Heparin Dosing Weight: 78kg  Vital Signs: Temp: 91 F (32.8 C) (07/01 1600) Temp Source: Core (Comment) (07/01 1600) BP: 118/77 mmHg (07/01 1645) Pulse Rate: 64 (07/01 1600)  Labs:  Recent Labs  03/04/2015 1037  03/21/2015 1435 03/16/2015 1638 03/20/2015 1839 03/13/2015 1842  03/11/2015 2225  03/25/15 0145 03/25/15 0400 03/25/15 0730 03/25/15 0930 03/25/15 1200 03/25/15 1625  HGB 8.9*  < >  --  11.6*  --  12.2  --   --   --   --  9.6*  --   --   --   --   HCT 32.7*  < >  --  34.0*  --  36.0  --   --   --   --  34.2*  --   --   --   --   PLT 268  --   --   --  289  --   --   --   --   --  291  --   --   --   --   APTT 26  --   --   --  36  --   --   --   --   --   --   --   --   --   --   LABPROT 17.4*  --   --   --  19.1*  --   --  22.1*  --   --   --   --   --   --   --   INR 1.41  --   --   --  1.60*  --   --  1.95*  --   --   --   --   --   --   --   HEPARINUNFRC  --   --   --   --   --   --   --   --   --  <0.10*  --   --  0.31  --  0.35  CREATININE 1.36*  < > 1.40* 1.10*  --  1.10*  < >  --   < >  --  1.05* 1.05*  --  0.96  --   TROPONINI  --   --  18.61*  --   --   --   --  40.99*  --   --  35.58*  --   --   --   --   < > = values in this interval not displayed.  Estimated Creatinine Clearance: 80.5 mL/min (by C-G formula based on Cr of 0.96).   Medications:  Scheduled:  . antiseptic oral rinse  7 mL Mouth Rinse QID  . artificial tears  1 application Both Eyes 3 times per day  . [START ON 03/26/2015] aspirin  81 mg Per Tube Daily  . chlorhexidine  15 mL Mouth Rinse BID  . fentaNYL (SUBLIMAZE) injection  100 mcg Intravenous Once  . insulin aspart  0-9 Units Subcutaneous 6 times per day  . pantoprazole (PROTONIX) IV  40 mg Intravenous QHS  . sodium  chloride  3 mL Intravenous Q12H  . sodium chloride  3 mL Intravenous Q12H  . ticagrelor  90 mg Per Tube BID    Assessment: 48 YOF s/p VF  arrest and CODE STEMI on hypothermia protocol. Heparin level this evening remains therapeutic (HL 0.35 << 0.31, goal of 0.3-0.7). Noted plans to start rewarming at 1730 - will increase the heparin drip rate slightly to 1100 units/hr to avoid subtherapeutic levels and plan to recheck another level 4 hours after rewarming has been initiated.   Goal of Therapy:  Heparin level 0.3-0.7 units/ml Monitor platelets by anticoagulation protocol: Yes   Plan:  1. Increase heparin to 1100 units/hr (11 ml/hr) 2. Will continue to monitor for any signs/symptoms of bleeding and will follow up with heparin level 4 hours after rewarming initiated.   Georgina Pillion, PharmD, BCPS Clinical Pharmacist Pager: [phone] 03/25/2015 4:56 PM     ---------------------------------------------------------------------------------------------------------------- Addendum:   Heparin level this evening remains therapeutic (HL 0.59 << 0.31). Rewarming was initiated at 1715. Will plan to recheck another HL in ~4-6 hours  Plan 1. Continue Heparin at 1100 units/hr (11 ml/hr) 2. Will continue to monitor for any signs/symptoms of bleeding and will follow up with heparin level in 4-6 hours (more frequent levels required in the setting of re-warming)  Georgina Pillion, PharmD, BCPS Clinical Pharmacist Pager: [phone] 03/25/2015 9:50 PM

## 2015-03-25 NOTE — Progress Notes (Signed)
Hypothermia machine prompted this RN, to begin rewarming pt at 1715. This was verified by [facility] RN.

## 2015-03-25 DEATH — deceased

## 2015-03-26 ENCOUNTER — Inpatient Hospital Stay (HOSPITAL_COMMUNITY): Payer: BLUE CROSS/BLUE SHIELD

## 2015-03-26 DIAGNOSIS — R0902 Hypoxemia: Secondary | ICD-10-CM

## 2015-03-26 DIAGNOSIS — G931 Anoxic brain damage, not elsewhere classified: Secondary | ICD-10-CM | POA: Insufficient documentation

## 2015-03-26 LAB — GLUCOSE, CAPILLARY
GLUCOSE-CAPILLARY: 111 mg/dL — AB (ref 65–99)
GLUCOSE-CAPILLARY: 114 mg/dL — AB (ref 65–99)
GLUCOSE-CAPILLARY: 122 mg/dL — AB (ref 65–99)
Glucose-Capillary: 108 mg/dL — ABNORMAL HIGH (ref 65–99)
Glucose-Capillary: 81 mg/dL (ref 65–99)
Glucose-Capillary: 118 mg/dL — ABNORMAL HIGH (ref 65–99)

## 2015-03-26 LAB — BASIC METABOLIC PANEL
ANION GAP: 10 (ref 5–15)
Anion gap: 11 (ref 5–15)
BUN: 20 mg/dL (ref 6–20)
BUN: 19 mg/dL (ref 6–20)
CALCIUM: 8.3 mg/dL — AB (ref 8.9–10.3)
CHLORIDE: 110 mmol/L (ref 101–111)
CO2: 20 mmol/L — ABNORMAL LOW (ref 22–32)
CO2: 19 mmol/L — ABNORMAL LOW (ref 22–32)
CREATININE: 1.01 mg/dL — AB (ref 0.44–1.00)
Calcium: 8.3 mg/dL — ABNORMAL LOW (ref 8.9–10.3)
Chloride: 109 mmol/L (ref 101–111)
Creatinine, Ser: 0.98 mg/dL (ref 0.44–1.00)
GFR calc Af Amer: >60 mL/min (ref >60)
GFR calc non Af Amer: >60 mL/min (ref >60)
GFR calc non Af Amer: >60 mL/min (ref >60)
GLUCOSE: 116 mg/dL — AB (ref 65–99)
Glucose, Bld: 113 mg/dL — ABNORMAL HIGH (ref 65–99)
POTASSIUM: 3.6 mmol/L (ref 3.5–5.1)
Potassium: 3.4 mmol/L — ABNORMAL LOW (ref 3.5–5.1)
Sodium: 140 mmol/L (ref 135–145)
Sodium: 139 mmol/L (ref 135–145)

## 2015-03-26 LAB — HEPARIN LEVEL (UNFRACTIONATED)
HEPARIN UNFRACTIONATED: 0.39 [IU]/mL (ref 0.30–0.70)
Heparin Unfractionated: <0.1 IU/mL — ABNORMAL LOW (ref 0.30–0.70)
Heparin Unfractionated: 0.29 IU/mL — ABNORMAL LOW (ref 0.30–0.70)

## 2015-03-26 LAB — POCT I-STAT 3, ART BLOOD GAS (G3+)
ACID-BASE DEFICIT: 8 mmol/L — AB (ref 0.0–2.0)
BICARBONATE: 17.2 meq/L — AB (ref 20.0–24.0)
O2 Saturation: 96 %
PCO2 ART: 32.8 mmHg — AB (ref 35.0–45.0)
Patient temperature: 37.1
TCO2: 18 mmol/L (ref 0–100)
pH, Arterial: 7.327 — ABNORMAL LOW (ref 7.350–7.450)
pO2, Arterial: 84 mmHg (ref 80.0–100.0)

## 2015-03-26 LAB — MAGNESIUM: Magnesium: 1.9 mg/dL (ref 1.7–2.4)

## 2015-03-26 LAB — CBC
HCT: 32.9 % — ABNORMAL LOW (ref 36.0–46.0)
HEMOGLOBIN: 9.3 g/dL — AB (ref 12.0–15.0)
MCH: 19.4 pg — AB (ref 26.0–34.0)
MCHC: 28.3 g/dL — AB (ref 30.0–36.0)
MCV: 68.7 fL — AB (ref 78.0–100.0)
Platelets: 315 10*3/uL (ref 150–400)
RBC: 4.79 MIL/uL (ref 3.87–5.11)
RDW: 20.2 % — ABNORMAL HIGH (ref 11.5–15.5)
WBC: 24.3 10*3/uL — ABNORMAL HIGH (ref 4.0–10.5)

## 2015-03-26 LAB — PHOSPHORUS: Phosphorus: 4.3 mg/dL (ref 2.5–4.6)

## 2015-03-26 MED ORDER — SODIUM CHLORIDE 0.9 % IV SOLN
1.0000 mg/h | INTRAVENOUS | Status: DC
  Administered 2015-03-26: 7 mg/h via INTRAVENOUS
  Administered 2015-03-26 – 2015-03-27 (×2): 2 mg/h via INTRAVENOUS
  Administered 2015-03-27: 4 mg/h via INTRAVENOUS
  Filled 2015-03-26 (×3): qty 10

## 2015-03-26 MED ORDER — LORAZEPAM 2 MG/ML IJ SOLN
2.0000 mg | INTRAMUSCULAR | Status: DC | PRN
  Administered 2015-03-26 (×2): 2 mg via INTRAVENOUS
  Filled 2015-03-26 (×2): qty 1

## 2015-03-26 MED ORDER — LORAZEPAM 2 MG/ML IJ SOLN
INTRAMUSCULAR | Status: AC
  Filled 2015-03-26: qty 3

## 2015-03-26 MED ORDER — SODIUM CHLORIDE 0.9 % IV SOLN
500.0000 mg | Freq: Two times a day (BID) | INTRAVENOUS | Status: DC
  Administered 2015-03-26 (×2): 500 mg via INTRAVENOUS
  Filled 2015-03-26 (×4): qty 5

## 2015-03-26 MED ORDER — VITAL AF 1.2 CAL PO LIQD
1000.0000 mL | ORAL | Status: DC
  Administered 2015-03-26 – 2015-03-29 (×5): 1000 mL
  Filled 2015-03-26 (×11): qty 1000

## 2015-03-26 MED ORDER — SODIUM CHLORIDE 0.9 % IV SOLN
3.0000 g | Freq: Four times a day (QID) | INTRAVENOUS | Status: DC
  Administered 2015-03-26 – 2015-03-30 (×16): 3 g via INTRAVENOUS
  Filled 2015-03-26 (×18): qty 3

## 2015-03-26 MED ORDER — LORAZEPAM 2 MG/ML IJ SOLN
5.0000 mg | Freq: Once | INTRAMUSCULAR | Status: AC
  Administered 2015-03-26: 5 mg via INTRAVENOUS

## 2015-03-26 MED ORDER — POTASSIUM CHLORIDE 10 MEQ/50ML IV SOLN
10.0000 meq | INTRAVENOUS | Status: AC
  Administered 2015-03-26 (×2): 10 meq via INTRAVENOUS
  Filled 2015-03-26 (×2): qty 50

## 2015-03-26 MED ORDER — VITAL HIGH PROTEIN PO LIQD
1000.0000 mL | ORAL | Status: DC
  Filled 2015-03-26 (×2): qty 1000

## 2015-03-26 MED ORDER — PRO-STAT 64 PO LIQD
30.0000 mL | Freq: Every morning | ORAL | Status: DC
  Administered 2015-03-26 – 2015-03-31 (×6): 30 mL
  Filled 2015-03-26 (×6): qty 30

## 2015-03-26 NOTE — Progress Notes (Signed)
ANTICOAGULATION CONSULT NOTE - Follow Up Consult  Pharmacy Consult for heparin Indication: chest pain/ACS  Not on File  Patient Measurements: Height: 5\' 7"  (170.2 cm) Weight: 185 lb 10.4 oz (84.21 kg) (86.6kg (bed weight)- 2.39kg (weight of pads with water)) IBW/kg (Calculated) : 61.6 Heparin Dosing Weight: 78kg  Vital Signs: Temp: 98.8 F (37.1 C) (07/02 1200) Temp Source: Oral (07/02 1200) BP: 111/57 mmHg (07/02 1400) Pulse Rate: 36 (07/02 1400)  Labs:  Recent Labs  03/01/2015 1037  03/06/2015 1435  03/02/2015 1839 03/17/2015 1842  02/25/2015 2225  03/25/15 0400  03/25/15 2041 03/25/15 2115 03/26/15 03/26/15 0300 03/26/15 0446 03/26/15 1033  HGB 8.9*  < >  --   < >  --  12.2  --   --   --  9.6*  --   --   --   --   --  9.3*  --   HCT 32.7*  < >  --   < >  --  36.0  --   --   --  34.2*  --   --   --   --   --  32.9*  --   PLT 268  --   --   --  289  --   --   --   --  291  --   --   --   --   --  315  --   APTT 26  --   --   --  36  --   --   --   --   --   --  102*  --   --   --   --   --   LABPROT 17.4*  --   --   --  19.1*  --   --  22.1*  --   --   --   --   --   --   --   --   --   INR 1.41  --   --   --  1.60*  --   --  1.95*  --   --   --   --   --   --   --   --   --   HEPARINUNFRC  --   --   --   --   --   --   --   --   < >  --   < >  --  0.59  --  0.39  --  <0.10*  CREATININE 1.36*  < > 1.40*  < >  --  1.10*  < >  --   < > 1.05*  < > 0.94  --  0.98  --  1.01*  --   TROPONINI  --   --  18.61*  --   --   --   --  40.99*  --  35.58*  --   --   --   --   --   --   --   < > = values in this interval not displayed.  Estimated Creatinine Clearance: 77.6 mL/min (by C-G formula based on Cr of 1.01).   Medications:  Scheduled:  . ampicillin-sulbactam (UNASYN) IV  3 g Intravenous Q6H  . antiseptic oral rinse  7 mL Mouth Rinse QID  . artificial tears  1 application Both Eyes 3 times per day  . aspirin  81 mg Per Tube Daily  . chlorhexidine  15 mL Mouth Rinse BID  .  feeding supplement (PRO-STAT 64)  30 mL Per Tube q morning - 10a  . insulin aspart  0-9 Units Subcutaneous 6 times per day  . levETIRAcetam  500 mg Intravenous Q12H  . LORazepam      . pantoprazole (PROTONIX) IV  40 mg Intravenous QHS  . sodium chloride  3 mL Intravenous Q12H  . sodium chloride  3 mL Intravenous Q12H  . ticagrelor  90 mg Per Tube BID    Assessment: 48 YOF s/p VF arrest and CODE STEMI on hypothermia protocol > now warm. Heparin level this am 0.39 but drip increased as patients usually start clearly heparin faster as they warm.  Heparin drip 1200 uts/hr HL fell to < 0.1.  Heparin running in L peripheral with no problems but arm is a [place] swollen> RN changed heparin drip to R arm peripheral site just and increase drip rate.  Will recheck level tonight.  CBC stable, no bleeding noted.    Goal of Therapy:  Heparin level 0.3-0.7 units/ml Monitor platelets by anticoagulation protocol: Yes   Plan:  1. Increase heparin to 1450 units/hr  2. Will continue to monitor for any signs/symptoms of bleeding and will follow up with heparin level 6 hours rate increase  Daily HL, CBC  Leota Sauers Pharm.D. CPP, BCPS Clinical Pharmacist [phone] 03/26/2015 2:30 PM    03/26/2015 2:26 PM

## 2015-03-26 NOTE — Progress Notes (Signed)
Sedation turned off. Muscle spasms started infrequently in the head and chest. Muscle spasms increased in frequency and increased to arms and legs. CCM notified. No new orders. Will continue to monitor.

## 2015-03-26 NOTE — Progress Notes (Signed)
Nutrition Follow-up  DOCUMENTATION CODES:  Not applicable  INTERVENTION: - Will order Vital AF 1.2 @ 60 mL/hr with 30 mL Prostat once/day to provide 1828 kcal, 123 grams protein, and 1168 mL free water. Free water flush per MD. - RD will continue to monitor for needs  NUTRITION DIAGNOSIS:  Inadequate oral intake related to inability to eat as evidenced by NPO status. -ongoing  GOAL:  Patient will meet greater than or equal to 90% of their needs -unmet at this time  MONITOR:  Vent status, Labs, Weight trends  REASON FOR ASSESSMENT:  Consult Enteral/tube feeding initiation and management  ASSESSMENT: Pt with hx of CHF possible non-compliance (limited resources) with medications admitted after vfib arrest; 25 minute CPR.  Pt om hypothermia protocol, cooled 1730 6/30.  7/2: Pt remains intubated on ventilator support and rewarming began 03/25/15 @ 1715. Per RN note, Nimbex turned off after 0435 call today with MD; pt began having head and chest and then added legs and arms spasms after sedation turned off.  MV: 16.2 L/min; no Propofol. Temp (24hrs), Avg:94.7 F (34.8 C), Min:89.2 F (31.8 C), Max:99.9 F (37.7 C)  Will order TF as outlined above; needs have been re-estimated based on heightened vent dependency.  Labs; CBGs: 108-114 mg/dL, K: 3.4 mg/dL, Ca: 8.3 mg/dL Drips: Levophed @ 20 mcg/min, Heparin @ 1200 units/hr   7/1: Patient is currently intubated on ventilator support MV: 9.2 L/min Temp (24hrs), Avg:92 F (33.3 C), Min:89.1 F (31.7 C), Max:96.4 F (35.8 C)  Pt with OG tube.  Medications and Labs reviewed: cbg: 28-148 Spoke with daughter at bedside, no recent weight loss and good appetite PTA.   Height:  Ht Readings from Last 1 Encounters:  03/15/2015 5\' 7"  (1.702 m)    Weight:  Wt Readings from Last 1 Encounters:  03/26/15 185 lb 10.4 oz (84.21 kg)    Ideal Body Weight:  61.3 kg  Wt Readings from Last 10 Encounters:  03/26/15 185 lb 10.4 oz  (84.21 kg)    BMI:  Body mass index is 29.07 kg/(m^2).  Estimated Nutritional Needs:  Kcal:  2043  Protein:  101-126 grams  Fluid:  >/= 1.5 L/day  Skin:  Reviewed, no issues  Diet Order:  NPO  EDUCATION NEEDS:  No education needs identified at this time   Intake/Output Summary (Last 24 hours) at 03/26/15 1137 Last data filed at 03/26/15 1000  Gross per 24 hour  Intake 1847.6 ml  Output    900 ml  Net  947.6 ml    Last BM:  PTA     Trenton Gammon, RD, LDN Inpatient Clinical Dietitian Pager # [phone] After hours/weekend pager # [phone]

## 2015-03-26 NOTE — Progress Notes (Signed)
EEG completed, results pending. 

## 2015-03-26 NOTE — Progress Notes (Addendum)
150 cc of fentanyl wasted from a  250 cc Fentanyl  Bag. Witnessed per two RN's

## 2015-03-26 NOTE — Progress Notes (Signed)
Notified ELink of noted shivering activity. Pt on Normothermia with pads in place.

## 2015-03-26 NOTE — Progress Notes (Signed)
eLink Physician-Brief Progress Note Patient Name: Anna Kemp DOB: [birth year] MRN: [id number]   Date of Service  03/26/2015  HPI/Events of Note  Shivering during normothermia /rewarm and tachypneic not responding to ativan bolus  eICU Interventions  Versed drip restarted      Intervention Category Minor Interventions: Agitation / anxiety - evaluation and management  Sandrea Hughs 03/26/2015, 8:00 PM

## 2015-03-26 NOTE — Procedures (Signed)
ELECTROENCEPHALOGRAM REPORT   Patient: Anna Kemp       Room #: 2H-09 Age: 49 y.o.        Sex: female Referring Physician: Dr Tyson Alias Report Date:  03/26/2015        Interpreting Physician: Omelia Blackwater  History: [name] is an 49 y.o. female admitted with V fib arrest, noted myoclonic movements upon waking  Medications:  UYE:BXIDHW chloride, sodium chloride, acetaminophen, LORazepam, ondansetron (ZOFRAN) IV, sodium chloride, sodium chloride  Conditions of Recording:  This is a 19 channel EEG carried out with the patient in the intubated state after recent administration of valium and ativan.   Description:  Early portion of EEG is technically limited due to the presence of generalized muscle artifact. The background consists predominantly of an extremely low voltage beta activity that at times appears isoelectric. No EEG response to external stimulation is noted. No focal slowing or epileptiform activity is noted.   An episode of myoclonic movement did not appear to have an EEG correlation.   Photic stimulation and hyperventilation were not performed.    IMPRESSION: Abnormal EEG due to the presence of a profound generalized cerebral dysfunction (encephalopathy) which can be seen with hypoxic encephalopathy. Of note, the recent administration of sedating medication does make this recording less define for prognostication.    Elspeth Cho, DO [facility] [phone]  If 7pm- 7am, please page neurology on call as listed in AMION. 03/26/2015, 11:35 AM

## 2015-03-26 NOTE — Progress Notes (Signed)
Notified Dr. Hosie Poisson of portable CT scanner not working at this time. CT tech called and updated on CT rep coming to evaluate scanner. Dr. Hosie Poisson stated if not available hold head CT until scanner working.

## 2015-03-26 NOTE — Progress Notes (Signed)
eLink Physician-Brief Progress Note Patient Name: Anna Kemp DOB: 1965-12-30 MRN: [id number]   Date of Service  03/26/2015  HPI/Events of Note    eICU Interventions  Hypokalemia -repleted      Intervention Category Intermediate Interventions: Electrolyte abnormality - evaluation and management  Vanna Shavers V. 03/26/2015, 6:12 AM

## 2015-03-26 NOTE — Progress Notes (Signed)
150 cc of fentanyl wasted from 250 cc fentanyl bag. Witnessed per two RN's.

## 2015-03-26 NOTE — Progress Notes (Signed)
ANTICOAGULATION CONSULT NOTE - Follow Up Consult  Pharmacy Consult for heparin Indication: s/p arrest 2/2 STEMI   Labs:  Recent Labs  03/05/2015 1037  02/23/2015 1435 03/16/2015 1638 03/05/2015 1839 03/17/2015 1842  03/22/2015 2225  03/25/15 0400  03/25/15 1625 03/25/15 2041 03/25/15 2115 03/26/15 03/26/15 0300  HGB 8.9*  < >  --  11.6*  --  12.2  --   --   --  9.6*  --   --   --   --   --   --   HCT 32.7*  < >  --  34.0*  --  36.0  --   --   --  34.2*  --   --   --   --   --   --   PLT 268  --   --   --  289  --   --   --   --  291  --   --   --   --   --   --   APTT 26  --   --   --  36  --   --   --   --   --   --   --  102*  --   --   --   LABPROT 17.4*  --   --   --  19.1*  --   --  22.1*  --   --   --   --   --   --   --   --   INR 1.41  --   --   --  1.60*  --   --  1.95*  --   --   --   --   --   --   --   --   HEPARINUNFRC  --   --   --   --   --   --   --   --   < >  --   < > 0.35  --  0.59  --  0.39  CREATININE 1.36*  < > 1.40* 1.10*  --  1.10*  < >  --   < > 1.05*  < > 0.93 0.94  --  0.98  --   TROPONINI  --   --  18.61*  --   --   --   --  40.99*  --  35.58*  --   --   --   --   --   --   < > = values in this interval not displayed.   Assessment: 49yo female remains within goal on heparin though level has dropped and now at lower end of goal, currently at 35.7 degrees and further warming to go, expect level to continue to decrease.  Goal of Therapy:  Heparin level 0.3-0.7 units/ml   Plan:  Will increase heparin gtt slightly to 1200 units/hr and check level in 6hr.  Vernard Gambles, PharmD, BCPS  03/26/2015,3:33 AM

## 2015-03-26 NOTE — Progress Notes (Signed)
ANTICOAGULATION CONSULT NOTE - Follow Up Consult  Pharmacy Consult for heparin Indication: chest pain/ACS  Not on File  Patient Measurements: Height:  (170.2 cm) Weight: 185 lb 10.4 oz (84.21 kg) (86.6kg (bed weight)- 2.39kg (weight of pads with water)) IBW/kg (Calculated) : 61.6 Heparin Dosing Weight: 78kg  Vital Signs: Temp: 99 F (37.2 C) (07/02 2200) Temp Source: Core (Comment) (07/02 2200) BP: 119/89 mmHg (07/02 2100) Pulse Rate: 107 (07/02 1650)  Labs:  Recent Labs  Apr 08, 2015 1037  04-08-2015 1435  04-08-2015 1839 08-Apr-2015 1842  04/08/2015 2225  03/25/15 0400  03/25/15 2041  03/26/15 03/26/15 0300 03/26/15 0446 03/26/15 1033 03/26/15 2100  HGB 8.9*  < >  --   < >  --  12.2  --   --   --  9.6*  --   --   --   --   --  9.3*  --   --   HCT 32.7*  < >  --   < >  --  36.0  --   --   --  34.2*  --   --   --   --   --  32.9*  --   --   PLT 268  --   --   --  289  --   --   --   --  291  --   --   --   --   --  315  --   --   APTT 26  --   --   --  36  --   --   --   --   --   --  102*  --   --   --   --   --   --   LABPROT 17.4*  --   --   --  19.1*  --   --  22.1*  --   --   --   --   --   --   --   --   --   --   INR 1.41  --   --   --  1.60*  --   --  1.95*  --   --   --   --   --   --   --   --   --   --   HEPARINUNFRC  --   --   --   --   --   --   --   --   < >  --   < >  --   < >  --  0.39  --  <0.10* 0.29*  CREATININE 1.36*  < > 1.40*  < >  --  1.10*  < >  --   < > 1.05*  < > 0.94  --  0.98  --  1.01*  --   --   TROPONINI  --   --  18.61*  --   --   --   --  40.99*  --  35.58*  --   --   --   --   --   --   --   --   < > = values in this interval not displayed.  Estimated Creatinine Clearance: 77.6 mL/min (by C-G formula based on Cr of 1.01).   Medications:  Scheduled:  . ampicillin-sulbactam (UNASYN) IV  3 g Intravenous Q6H  . antiseptic oral rinse  7 mL Mouth Rinse QID  .  artificial tears  1 application Both Eyes 3 times per day  . aspirin  81 mg Per Tube  Daily  . chlorhexidine  15 mL Mouth Rinse BID  . feeding supplement (PRO-STAT 64)  30 mL Per Tube q morning - 10a  . insulin aspart  0-9 Units Subcutaneous 6 times per day  . levETIRAcetam  500 mg Intravenous Q12H  . pantoprazole (PROTONIX) IV  40 mg Intravenous QHS  . sodium chloride  3 mL Intravenous Q12H  . sodium chloride  3 mL Intravenous Q12H  . ticagrelor  90 mg Per Tube BID    Assessment: 48 YOF s/p VF arrest and CODE STEMI on hypothermia protocol > now warm. Heparin level 0.29 on 1450 units/hr. No issue with infusion. No bleeding noted   Goal of Therapy:  Heparin level 0.3-0.7 units/ml Monitor platelets by anticoagulation protocol: Yes   Plan:  1. Increase heparin to 1550 units/hr  2. Will f/u AM heparin level  Daily HL, CBC  Bayard Hugger, PharmD, BCPS  Clinical Pharmacist  Pager: [phone]   03/26/2015 10:20 PM    03/26/2015 10:20 PM

## 2015-03-26 NOTE — Progress Notes (Addendum)
PULMONARY / CRITICAL CARE MEDICINE   Name: Labrittany Wechter MRN: [id number] DOB: [birth year]    ADMISSION DATE:  03/19/2015  REFERRING MD :  EDP  CHIEF COMPLAINT:  Post arrest   INITIAL PRESENTATION:  49yo female with hx cardiomyopathy presented 6/30 after VFib arrest.  Approx 25 minutes CPR.  Evaluated in ER and code STEMI called, pt taken emergently to cath lab.  PCCM called to admit for hypothermia protocol.   MAJOR EVENTS/TEST RESULTS: 6/30 Admitted to CCU after prolonged cardiac arrest. Hypothermia protocol initiated 6/30 CT head: NAICP 6/30 LHC: Normal coronary arteries except for thrombotic apical occlusion of LAD. Very severe global LV systolic dysfunction with LVEF 10%. Unsuccessful PCI of the apical LAD with attempted aspiration thrombectomy and PTCA. 7/01 EEG: consistent with a profound generalized cerebral dysfunction(encephalopathy) as can be seen with hypoxic encephalopathy. The presence of sedating medication and hypothermia does make this recording less definite for prognostication. There was no seizure or seizure predisposition recorded on this study 7/01 TTE: LVEF 15%. Grade 2 DD 7/2- myoclonus  INDWELLING DEVICES:: ETT 6/30 >>  L [place] CVL 6/30 >>   MICRO DATA: Urine 6/30 >>  Blood 6/30 >>   ANTIMICROBIALS:    SUBJECTIVE:  Myoclonus arms  VITAL SIGNS: Temp:  [89.2 F (31.8 C)-99.9 F (37.7 C)] 99.9 F (37.7 C) (07/02 0800) Pulse Rate:  [57-115] 115 (07/02 0832) Resp:  [18-24] 24 (07/02 0832) BP: (97-134)/(63-88) 116/80 mmHg (07/02 0832) SpO2:  [97 %-100 %] 98 % (07/02 0832) FiO2 (%):  [40 %] 40 % (07/02 0832) Weight:  [84.21 kg (185 lb 10.4 oz)] 84.21 kg (185 lb 10.4 oz) (07/02 0230) HEMODYNAMICS: CVP:  [10 mmHg-14 mmHg] 12 mmHg VENTILATOR SETTINGS: Vent Mode:  [-] PRVC FiO2 (%):  [40 %] 40 % Set Rate:  [18 bmp] 18 bmp Vt Set:  [500 mL] 500 mL PEEP:  [5 cmH20] 5 cmH20 Plateau Pressure:  [23 cmH20-26 cmH20] 23 cmH20 INTAKE / OUTPUT:  Intake/Output  Summary (Last 24 hours) at 03/26/15 0915 Last data filed at 03/26/15 0800  Gross per 24 hour  Intake 2036.05 ml  Output    880 ml  Net 1156.05 ml    PHYSICAL EXAMINATION: General: intubated, myoclonus amrs / eyes with suctioning Neuro: PERRL 3mm sluggish, cough / gag wnl HEENT: NCAT Cardiovascular: s1 s2 TRR Lungs: rochi slight Abdomen:  Soft, diminished BS Ext: cool, no edema    LABS:  CBC  Recent Labs Lab 03/23/2015 1037  02/24/2015 1839 03/23/2015 1842 03/25/15 0400 03/26/15 0446  WBC 30.3*  --   --   --  23.0* 24.3*  HGB 8.9*  < >  --  12.2 9.6* 9.3*  HCT 32.7*  < >  --  36.0 34.2* 32.9*  PLT 268  --  289  --  291 315  < > = values in this interval not displayed. Coag's  Recent Labs Lab 03/22/2015 1037 03/08/2015 1839 03/08/2015 2225 03/25/15 2041  APTT 26 36  --  102*  INR 1.41 1.60* 1.95*  --    BMET  Recent Labs Lab 03/25/15 2041 03/26/15 03/26/15 0446  NA 141 140 139  K 3.7 3.6 3.4*  CL 112* 110 109  CO2 21* 20* 19*  BUN CREATININE 0.94 0.98 1.01*  GLUCOSE 115* 113* 116*   Electrolytes  Recent Labs Lab 03/13/2015 1037  03/25/15 0400  03/25/15 2041 03/26/15 03/26/15 0446  CALCIUM 7.6*  < > 8.3*  < > 8.5* 8.3* 8.3*  MG 2.0  --  2.1  --   --   --  1.9  PHOS  --   --  4.2  --   --   --  4.3  < > = values in this interval not displayed. Sepsis Markers  Recent Labs Lab 03/30/2015 1047  LATICACIDVEN 6.15*   ABG  Recent Labs Lab 30-Mar-2015 1128 03/30/2015 1545  PHART 7.243* 7.336*  PCO2ART 45.2* 38.4  PO2ART 135.0* 295.0*   Liver Enzymes  Recent Labs Lab Mar 30, 2015 1037  AST 84*  ALT 55*  ALKPHOS 52  BILITOT 1.0  ALBUMIN 3.0*   Cardiac Enzymes  Recent Labs Lab 03-30-15 1435 30-Mar-2015 2225 03/25/15 0400  TROPONINI 18.61* 40.99* 35.58*   Glucose  Recent Labs Lab 03/25/15 1355 03/25/15 1625 03/25/15 2029 03/26/15 0046 03/26/15 0432 03/26/15 0737  GLUCAP 143* 119* 112* 111* 114* 108*    CXR: 6/30 mild CM and edema  pattern NONE since  ASSESSMENT / PLAN:  PULMONARY Acute respiratory failure post cardiac arrest  P:   Post rewarming pcxr now for volume status Repeat abg as rewarmed, may need increased RR May be able to SBT, await above  CARDIOVASCULAR Cardiac arrest  Severe cardiomyopathy STEMI Cardiogenic shock P:  Cont vasopressors to maintain MAP >60 Change MAP goal to 60 , rewarm and EF 10%, may naccept lower in future Tele Heparin, asa  RENAL Recurrent hypokalemia P:   k supp Bolus given during rewarm, allow further pos balance Avoid free water risk brain edema  GASTROINTESTINAL No active issue  P:   SUP: IV PPI Consider TFs today  HEMATOLOGIC Anemia, no overt bleding noted  P:  DVT px: full dose heparin Monitor CBC intermittently Transfuse per usual ICU guidelines  INFECTIOUS R/o aspiration, hypothermia increased risk infection P:   Some difficulty keeping at 37, fever? 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 Tyson Alias, MD, FACP Pgr: [phone] [facility]   After re entering room. Whole body stiff Seziures? Not seen on EEG? wil call neuro, cont eeg? Il;l add keppra, ativan, keep versed CT head  Mcarthur Rossetti. Tyson Alias, MD, FACP Pgr: [phone] [facility]  Updated brother of likley poor outcome

## 2015-03-26 NOTE — Progress Notes (Signed)
Patient's core temperature has not reached 36C (peaked at 35.8C then decreased to 35.6C). Water temperature of arctic sun at [employer]. CCM paged. Instructed to turn Nimbex off now (03/26/15 04:11). Will continue to monitor.

## 2015-03-26 NOTE — Consult Note (Addendum)
Consult Reason for Consult: seizure activity Referring Physician: Dr Tyson Alias CCM  CC: post arrest seizure activity  HPI: Anna Kemp is an 49 y.o. female hx of CHF/cardiomyopathy presented on 6/30 after V fib arrest. Approximately of CPR before ROSC. Underwent hypothermia protocol. Overnight when sedation turned off,  patient noted to have muscle spasms in her extremities consistent with myoclonic activity. Became more pronounced and frequent this morning. Given a total of  ativan and  of versed with resolution of movements.   CT head imaging from 6/30 reviewed and overall unremarkable.   EEG on 6/30 (during hypothermia protocol) shows profound generalized cerebral dysfunction though limited prognostic value due to sedating medication and hypothermia.   Past Medical History  Diagnosis Date  . CHF (congestive heart failure)     Past Surgical History  Procedure Laterality Date  . Cardiac catheterization N/A 04/10/15    Procedure: Left Heart Cath and Coronary Angiography;  Surgeon: Tonny Bollman, MD;  Location: [facility] INVASIVE CV LAB;  Service: Cardiovascular;  Laterality: N/A;    Family History  Problem Relation Age of Onset  . Congestive Heart Failure Mother   . Coronary artery disease Mother     Social History:  reports that she has been smoking.  She does not have any smokeless tobacco history on file. Her alcohol and drug histories are not on file.  Not on File  Medications:  Scheduled: . antiseptic oral rinse  7 mL Mouth Rinse QID  . artificial tears  1 application Both Eyes 3 times per day  . aspirin  81 mg Per Tube Daily  . chlorhexidine  15 mL Mouth Rinse BID  . feeding supplement (VITAL HIGH PROTEIN)  1,000 mL Per Tube Q24H  . insulin aspart  0-9 Units Subcutaneous 6 times per day  . levETIRAcetam  500 mg Intravenous Q12H  . LORazepam      . pantoprazole (PROTONIX) IV  40 mg Intravenous QHS  . sodium chloride  3 mL Intravenous Q12H  . sodium chloride   3 mL Intravenous Q12H  . ticagrelor  90 mg Per Tube BID    ROS: Out of a complete 14 system review, the patient complains of only the following symptoms, and all other reviewed systems are negative. Unable to assess  Physical Examination: Filed Vitals:   03/26/15 0832  BP: 116/80  Pulse: 115  Temp:   Resp: 24   Physical Exam  Constitutional: He appears well-developed and well-nourished.  Psych: Affect appropriate to situation Eyes: No scleral injection HENT: No OP obstrucion Head: Normocephalic.  Cardiovascular: Normal rate and regular rhythm.  Respiratory: Effort normal and breath sounds normal.  GI: Soft. Bowel sounds are normal. No distension. There is no tenderness.  Skin: WDI  Neurologic Examination Patient does not respond to verbal stimuli.  Does not respond to deep sternal rub.  Does not follow commands.  No verbalizations noted.  Cranial Nerves: II: patient does not respond confrontation bilaterally, pupils right 3 mm, left 3 mm,and reactive bilaterally III,IV,VI: doll's response sluggish bilaterally.  V,VII: corneal reflex reduced bilaterally  VIII: patient does not respond to verbal stimuli IX,X: gag reflex present, XI: trapezius strength unable to test bilaterally XII: tongue strength unable to test Motor: Extremities flaccid throughout.  No spontaneous movement noted.  No purposeful movements noted. Sensory: Does not respond to noxious stimuli in any extremity. Deep Tendon Reflexes:  Absent throughout. Plantars: equivocal bilaterally Cerebellar: Unable to perform  Laboratory Studies:   Basic Metabolic Panel:  Recent Labs  Lab 03/12/2015 1037  03/25/15 0400  03/25/15 1200 03/25/15 1625 03/25/15 2041 03/26/15 03/26/15 0446  NA 138  < > 141  < > 139 138 141 140 139  K 3.1*  < > 4.1  < > 3.7 3.5 3.7 3.6 3.4*  CL 105  < > 111  < > 109 108 112* 110 109  CO2 18*  < > 22  < > 20* 20* 21* 20* 19*  GLUCOSE 265*  < > 137*  < > 149* 128* 115* 113* 116*   BUN 18  < > 20  < > 20 20 19 20 19   CREATININE 1.36*  < > 1.05*  < > 0.96 0.93 0.94 0.98 1.01*  CALCIUM 7.6*  < > 8.3*  < > 8.5* 8.4* 8.5* 8.3* 8.3*  MG 2.0  --  2.1  --   --   --   --   --  1.9  PHOS  --   --  4.2  --   --   --   --   --  4.3  < > = values in this interval not displayed.  Liver Function Tests:  Recent Labs Lab 03/07/2015 1037  AST 84*  ALT 55*  ALKPHOS 52  BILITOT 1.0  PROT 6.1*  ALBUMIN 3.0*   No results for input(s): LIPASE, AMYLASE in the last 168 hours. No results for input(s): AMMONIA in the last 168 hours.  CBC:  Recent Labs Lab 03/10/2015 1037  03/12/2015 1434 03/08/2015 1638 03/17/2015 1839 03/17/2015 1842 03/25/15 0400 03/26/15 0446  WBC 30.3*  --   --   --   --   --  23.0* 24.3*  NEUTROABS 26.7*  --   --   --   --   --   --   --   HGB 8.9*  < > 11.9* 11.6*  --  12.2 9.6* 9.3*  HCT 32.7*  < > 35.0* 34.0*  --  36.0 34.2* 32.9*  MCV 70.8*  --   --   --   --   --  70.7* 68.7*  PLT 268  --   --   --  289  --  291 315  < > = values in this interval not displayed.  Cardiac Enzymes:  Recent Labs Lab 03/04/2015 1435 03/17/2015 2225 03/25/15 0400  TROPONINI 18.61* 40.99* 35.58*    BNP: Invalid input(s): POCBNP  CBG:  Recent Labs Lab 03/25/15 1625 03/25/15 2029 03/26/15 0046 03/26/15 0432 03/26/15 0737  GLUCAP 119* 112* 111* 114* 108*    Microbiology: Results for orders placed or performed during the hospital encounter of 03/20/2015  Culture, Urine     Status: None   Collection Time: 02/28/2015 10:32 AM  Result Value Ref Range Status   Specimen Description URINE, CATHETERIZED  Final   Special Requests NONE  Final   Culture NO GROWTH 1 DAY  Final   Report Status 03/25/2015 FINAL  Final  Culture, blood (routine x 2)     Status: None (Preliminary result)   Collection Time: 03/11/2015 10:37 AM  Result Value Ref Range Status   Specimen Description BLOOD RIGHT ANTECUBITAL  Final   Special Requests BOTTLES DRAWN AEROBIC ONLY 5CC  Final   Culture  NO GROWTH 1 DAY  Final   Report Status PENDING  Incomplete  MRSA PCR Screening     Status: None   Collection Time: 03/07/2015  2:37 PM  Result Value Ref Range Status   MRSA by PCR NEGATIVE NEGATIVE  Final    Comment:        The GeneXpert MRSA Assay (FDA approved for NASAL specimens only), is one component of a comprehensive MRSA colonization surveillance program. It is not intended to diagnose MRSA infection nor to guide or monitor treatment for MRSA infections.   Culture, blood (routine x 2)     Status: None (Preliminary result)   Collection Time: 2015-04-01  4:45 PM  Result Value Ref Range Status   Specimen Description BLOOD LEFT HAND  Final   Special Requests BOTTLES DRAWN AEROBIC ONLY 4CC  Final   Culture NO GROWTH < 24 HOURS  Final   Report Status PENDING  Incomplete    Coagulation Studies:  Recent Labs  04-01-15 1037 04-01-2015 1839 2015/04/01 2225  LABPROT 17.4* 19.1* 22.1*  INR 1.41 1.60* 1.95*    Urinalysis: No results for input(s): COLORURINE, LABSPEC, PHURINE, GLUCOSEU, HGBUR, BILIRUBINUR, KETONESUR, PROTEINUR, UROBILINOGEN, NITRITE, LEUKOCYTESUR in the last 168 hours.  Invalid input(s): APPERANCEUR  Lipid Panel:  No results found for: CHOL, TRIG, HDL, CHOLHDL, VLDL, LDLCALC  HgbA1C: No results found for: HGBA1C  Urine Drug Screen:     Component Value Date/Time   LABOPIA NONE DETECTED Apr 01, 2015 1032   COCAINSCRNUR NONE DETECTED Apr 01, 2015 1032   LABBENZ NONE DETECTED 2015-04-01 1032   AMPHETMU POSITIVE* Apr 01, 2015 1032   THCU NONE DETECTED 2015/04/01 1032   LABBARB NONE DETECTED 2015/04/01 1032    Alcohol Level: No results for input(s): ETH in the last 168 hours.   Imaging: Ct Head Wo Contrast  April 01, 2015   CLINICAL DATA:  Post CPR and cardiac arrest, on ventilator  EXAM: CT HEAD WITHOUT CONTRAST  TECHNIQUE: Contiguous axial images were obtained from the base of the skull through the vertex without intravenous contrast.  COMPARISON:  None.  FINDINGS: No  skull fracture is noted. No intracranial hemorrhage, mass effect or midline shift. Secretions are noted in nasopharynx.  No hydrocephalus. No intra or extra-axial fluid collection. No acute cortical infarction. No intraventricular hemorrhage. No mass lesion is noted on this unenhanced scan.  IMPRESSION: No intracranial hemorrhage, mass effect or midline shift. No definite acute cortical infarction. No mass lesion is noted on this unenhanced scan.   Electronically Signed   By: Natasha Mead M.D.   On: 04/01/2015 11:37   Dg Chest Portable 1 View  04/01/2015   ADDENDUM REPORT: 01-Apr-2015 11:45  ADDENDUM: There is a left subclavian central line with tip in SVC right atrium junction. No pneumothorax.   Electronically Signed   By: Natasha Mead M.D.   On: 04-01-15 11:45   01-Apr-2015   CLINICAL DATA:  Cold STEMI  EXAM: PORTABLE CHEST - 1 VIEW  COMPARISON:  None.  FINDINGS: Cardiomegaly. Endotracheal tube with tip 4.1 cm above the carina. No acute infiltrate or pulmonary edema. Mild left basilar atelectasis. No pneumothorax.  IMPRESSION: Endotracheal tube with tip 4 cm above the carina. No pneumothorax. Cardiomegaly. Left basilar atelectasis.  Electronically Signed: By: Natasha Mead M.D. On: 04-01-2015 11:31   Dg Abd Portable 1v  03/26/2015   CLINICAL DATA:  OG tube dislodged, replaced.  EXAM: PORTABLE ABDOMEN - 1 VIEW  COMPARISON:  None.  FINDINGS: OG tube tip is in the region of the body of the stomach. Nonobstructive bowel gas pattern. No evidence of free air.  IMPRESSION: OG tube tip in the stomach body.   Electronically Signed   By: Charlett Nose M.D.   On: 03/26/2015 08:14     Assessment/Plan:  48y/o woman admitted  on 6/30 after V fib arrest with approximately 25 minutes of CPR. Underwent hypothermia protocol. Once sedation removed she was noted to have myoclonic type movements of her extremities. Given ativan and valium by CCM which resolved the symptoms. Concern for anoxic encephalopathy based on history.    -stat EEG -agree with keppra 500mg  BID -agree with repeat head CT -will follow as needed   Elspeth Cho, DO [facility] [phone]  If 7pm- 7am, please page neurology on call as listed in AMION. 03/26/2015, 10:13 AM

## 2015-03-27 ENCOUNTER — Inpatient Hospital Stay (HOSPITAL_COMMUNITY): Payer: BLUE CROSS/BLUE SHIELD

## 2015-03-27 LAB — GLUCOSE, CAPILLARY
GLUCOSE-CAPILLARY: 95 mg/dL (ref 65–99)
Glucose-Capillary: 102 mg/dL — ABNORMAL HIGH (ref 65–99)
Glucose-Capillary: 76 mg/dL (ref 65–99)
Glucose-Capillary: 86 mg/dL (ref 65–99)
Glucose-Capillary: 94 mg/dL (ref 65–99)
Glucose-Capillary: 98 mg/dL (ref 65–99)

## 2015-03-27 LAB — CBC WITH DIFFERENTIAL/PLATELET
BASOS ABS: 0 10*3/uL (ref 0.0–0.1)
Basophils Relative: 0 % (ref 0–1)
Eosinophils Absolute: 0 10*3/uL (ref 0.0–0.7)
Eosinophils Relative: 0 % (ref 0–5)
HCT: 29.6 % — ABNORMAL LOW (ref 36.0–46.0)
HEMOGLOBIN: 8.3 g/dL — AB (ref 12.0–15.0)
Lymphocytes Relative: 4 % — ABNORMAL LOW (ref 12–46)
Lymphs Abs: 0.9 10*3/uL (ref 0.7–4.0)
MCH: 19.3 pg — AB (ref 26.0–34.0)
MCHC: 28 g/dL — ABNORMAL LOW (ref 30.0–36.0)
MCV: 68.8 fL — AB (ref 78.0–100.0)
Monocytes Absolute: 1.9 10*3/uL — ABNORMAL HIGH (ref 0.1–1.0)
Monocytes Relative: 8 % (ref 3–12)
Neutro Abs: 20.8 10*3/uL — ABNORMAL HIGH (ref 1.7–7.7)
Neutrophils Relative %: 88 % — ABNORMAL HIGH (ref 43–77)
Platelets: 300 10*3/uL (ref 150–400)
RBC: 4.3 MIL/uL (ref 3.87–5.11)
RDW: 20.7 % — AB (ref 11.5–15.5)
WBC: 23.6 10*3/uL — AB (ref 4.0–10.5)

## 2015-03-27 LAB — HEPARIN LEVEL (UNFRACTIONATED)
Heparin Unfractionated: 0.29 IU/mL — ABNORMAL LOW (ref 0.30–0.70)
Heparin Unfractionated: 0.55 IU/mL (ref 0.30–0.70)

## 2015-03-27 LAB — COMPREHENSIVE METABOLIC PANEL
ALBUMIN: 2.7 g/dL — AB (ref 3.5–5.0)
ALT: 57 U/L — ABNORMAL HIGH (ref 14–54)
AST: 129 U/L — AB (ref 15–41)
Alkaline Phosphatase: 51 U/L (ref 38–126)
Anion gap: 10 (ref 5–15)
BILIRUBIN TOTAL: 1.2 mg/dL (ref 0.3–1.2)
BUN: 20 mg/dL (ref 6–20)
CO2: 21 mmol/L — ABNORMAL LOW (ref 22–32)
Calcium: 8.5 mg/dL — ABNORMAL LOW (ref 8.9–10.3)
Chloride: 111 mmol/L (ref 101–111)
Creatinine, Ser: 0.83 mg/dL (ref 0.44–1.00)
GFR calc non Af Amer: >60 mL/min (ref >60)
Glucose, Bld: 92 mg/dL (ref 65–99)
Potassium: 3.6 mmol/L (ref 3.5–5.1)
SODIUM: 142 mmol/L (ref 135–145)
Total Protein: 5.9 g/dL — ABNORMAL LOW (ref 6.5–8.1)

## 2015-03-27 LAB — BLOOD GAS, ARTERIAL
Acid-base deficit: 5.3 mmol/L — ABNORMAL HIGH (ref 0.0–2.0)
BICARBONATE: 18.3 meq/L — AB (ref 20.0–24.0)
Drawn by: 36277
FIO2: 0.4 %
LHR: 20 {breaths}/min
MECHVT: 500 mL
O2 Saturation: 98.8 %
PEEP: 5 cmH2O
PO2 ART: 110 mmHg — AB (ref 80.0–100.0)
Patient temperature: 97.9
TCO2: 19.1 mmol/L (ref 0–100)
pCO2 arterial: 27.5 mmHg — ABNORMAL LOW (ref 35.0–45.0)
pH, Arterial: 7.435 (ref 7.350–7.450)

## 2015-03-27 LAB — PHOSPHORUS: Phosphorus: 3.1 mg/dL (ref 2.5–4.6)

## 2015-03-27 LAB — MAGNESIUM: Magnesium: 2 mg/dL (ref 1.7–2.4)

## 2015-03-27 MED ORDER — VALPROATE SODIUM 500 MG/5ML IV SOLN
500.0000 mg | Freq: Two times a day (BID) | INTRAVENOUS | Status: DC
  Administered 2015-03-27 – 2015-03-31 (×9): 500 mg via INTRAVENOUS
  Filled 2015-03-27 (×10): qty 5

## 2015-03-27 MED ORDER — FUROSEMIDE 10 MG/ML IJ SOLN
40.0000 mg | Freq: Two times a day (BID) | INTRAMUSCULAR | Status: AC
  Administered 2015-03-27 – 2015-03-28 (×4): 40 mg via INTRAVENOUS
  Filled 2015-03-27 (×4): qty 4

## 2015-03-27 MED ORDER — POTASSIUM CHLORIDE 20 MEQ/15ML (10%) PO SOLN
40.0000 meq | Freq: Once | ORAL | Status: AC
  Administered 2015-03-27: 40 meq
  Filled 2015-03-27: qty 30

## 2015-03-27 MED ORDER — PANTOPRAZOLE SODIUM 40 MG PO PACK
40.0000 mg | PACK | Freq: Every day | ORAL | Status: DC
  Administered 2015-03-27 – 2015-03-31 (×5): 40 mg
  Filled 2015-03-27 (×5): qty 20

## 2015-03-27 NOTE — Progress Notes (Signed)
ANTICOAGULATION CONSULT NOTE - Follow Up Consult  Pharmacy Consult for heparin Indication: chest pain/ACS  Not on File  Patient Measurements: Height: 5\' 7"  (170.2 cm) Weight: 190 lb 15.1 oz (86.61 kg) (89.0 - 2.39) IBW/kg (Calculated) : 61.6 Heparin Dosing Weight: 78kg  Vital Signs: Temp: 98.1 F (36.7 C) (07/03 1200) Temp Source: Core (Comment) (07/03 1200) BP: 117/79 mmHg (07/03 1200) Pulse Rate: 111 (07/03 1130)  Labs:  Recent Labs  02/28/2015 1435  03/08/2015 1839  02/24/2015 2225  03/25/15 0400  03/25/15 2041  03/26/15  03/26/15 0446  03/26/15 2100 03/27/15 0517 03/27/15 0518 03/27/15 1245  HGB  --   < >  --   < >  --   --  9.6*  --   --   --   --   --  9.3*  --   --  8.3*  --   --   HCT  --   < >  --   < >  --   --  34.2*  --   --   --   --   --  32.9*  --   --  29.6*  --   --   PLT  --   < > 289  --   --   --  291  --   --   --   --   --  315  --   --  300  --   --   APTT  --   --  36  --   --   --   --   --  102*  --   --   --   --   --   --   --   --   --   LABPROT  --   --  19.1*  --  22.1*  --   --   --   --   --   --   --   --   --   --   --   --   --   INR  --   --  1.60*  --  1.95*  --   --   --   --   --   --   --   --   --   --   --   --   --   HEPARINUNFRC  --   --   --   --   --   < >  --   < >  --   < >  --   < >  --   < > 0.29*  --  0.29* 0.55  CREATININE 1.40*  < >  --   < >  --   < > 1.05*  < > 0.94  --  0.98  --  1.01*  --   --  0.83  --   --   TROPONINI 18.61*  --   --   --  40.99*  --  35.58*  --   --   --   --   --   --   --   --   --   --   --   < > = values in this interval not displayed.  Estimated Creatinine Clearance: 95.7 mL/min (by C-G formula based on Cr of 0.83).   Medications:  Scheduled:  . ampicillin-sulbactam (UNASYN) IV  3 g Intravenous Q6H  . antiseptic oral rinse  7 mL  Mouth Rinse QID  . artificial tears  1 application Both Eyes 3 times per day  . aspirin  81 mg Per Tube Daily  . chlorhexidine  15 mL Mouth Rinse BID  . feeding  supplement (PRO-STAT 64)  30 mL Per Tube q morning - 10a  . furosemide  40 mg Intravenous Q12H  . insulin aspart  0-9 Units Subcutaneous 6 times per day  . pantoprazole sodium  40 mg Per Tube Daily  . sodium chloride  3 mL Intravenous Q12H  . sodium chloride  3 mL Intravenous Q12H  . ticagrelor  90 mg Per Tube BID  . valproate sodium  500 mg Intravenous Q12H    Assessment: 48 YOF s/p VF arrest - on hypothermia protocol - now warm.  CODE STEMI - s/p LAD thrombectomy remains on heparin drip 1700 uts/hr heparin level 0.55 - after ,ultiple rate increases since warmed and clearing heparin. No issue with infusion. No bleeding noted   Goal of Therapy:  Heparin level 0.3-0.7 units/ml Monitor platelets by anticoagulation protocol: Yes   Plan:  1. Continue heparin to 1700 units/hr  2. Will f/u AM heparin level  Daily HL, CBC  Leota Sauers Pharm.D. CPP, BCPS Clinical Pharmacist [phone] 03/27/2015 2:00 PM       03/27/2015 1:58 PM

## 2015-03-27 NOTE — Progress Notes (Signed)
PULMONARY / CRITICAL CARE MEDICINE   Name: Anna Kemp MRN: [id number] DOB: 01-22-1966    ADMISSION DATE:  03/14/2015  REFERRING MD :  EDP  CHIEF COMPLAINT:  Post arrest   INITIAL PRESENTATION:  49yo female with hx cardiomyopathy presented 6/30 after VFib arrest.  Approx 25 minutes CPR.  Evaluated in ER and code STEMI called, pt taken emergently to cath lab.  PCCM called to admit for hypothermia protocol.   MAJOR EVENTS/TEST RESULTS: 6/30 Admitted to CCU after prolonged cardiac arrest. Hypothermia protocol initiated 6/30 CT head: NAICP 6/30 LHC: Normal coronary arteries except for thrombotic apical occlusion of LAD. Very severe global LV systolic dysfunction with LVEF 10%. Unsuccessful PCI of the apical LAD with attempted aspiration thrombectomy and PTCA. 7/01 EEG: consistent with a profound generalized cerebral dysfunction(encephalopathy) as can be seen with hypoxic encephalopathy. The presence of sedating medication and hypothermia does make this recording less definite for prognostication. There was no seizure or seizure predisposition recorded on this study 7/01 TTE: LVEF 15%. Grade 2 DD 7/2- myoclonus 7/2 eeg-Abnormal EEG due to the presence of a profound generalized cerebral dysfunction (encephalopathy) which can be seen with hypoxic encephalopathy  INDWELLING DEVICES:: ETT 6/30 >>  L [place] CVL 6/30 >>   MICRO DATA: Urine 6/30 >>  Blood 6/30 >>   ANTIMICROBIALS:    SUBJECTIVE:  Myoclonus overnight, versed drip was required  VITAL SIGNS: Temp:  [97.7 F (36.5 C)-99.7 F (37.6 C)] 98.6 F (37 C) (07/03 0800) Pulse Rate:  [32-117] 112 (07/03 0805) Resp:  [23-45] 31 (07/03 0805) BP: (88-142)/(57-89) 110/78 mmHg (07/03 0805) SpO2:  [92 %-100 %] 92 % (07/03 0805) FiO2 (%):  [40 %] 40 % (07/03 0805) Weight:  [86.61 kg (190 lb 15.1 oz)] 86.61 kg (190 lb 15.1 oz) (07/03 0435) HEMODYNAMICS: CVP:  [9 mmHg-20 mmHg] 20 mmHg VENTILATOR SETTINGS: Vent Mode:  [-] PRVC FiO2  (%):  [40 %] 40 % Set Rate:  [20 bmp] 20 bmp Vt Set:  [500 mL] 500 mL PEEP:  [5 cmH20] 5 cmH20 Plateau Pressure:  [23 cmH20] 23 cmH20 INTAKE / OUTPUT:  Intake/Output Summary (Last 24 hours) at 03/27/15 0846 Last data filed at 03/27/15 0700  Gross per 24 hour  Intake 2351.75 ml  Output   1160 ml  Net 1191.75 ml    PHYSICAL EXAMINATION: General: intubated, myoclonus reduce with versed Neuro: PERRL 70mm sluggish, cough / gag absent to slight HEENT: jvd increased Cardiovascular: s1 s2 TRR Lungs: rochi unchanged Abdomen:  Soft, diminished BS Ext: cool, no edema    LABS:  CBC  Recent Labs Lab 03/25/15 0400 03/26/15 0446 03/27/15 0517  WBC 23.0* 24.3* 23.6*  HGB 9.6* 9.3* 8.3*  HCT 34.2* 32.9* 29.6*  PLT 291 315 300   Coag's  Recent Labs Lab 03/19/2015 1037 03/15/2015 1839 03/07/2015 2225 03/25/15 2041  APTT 26 36  --  102*  INR 1.41 1.60* 1.95*  --    BMET  Recent Labs Lab 03/26/15 03/26/15 0446 03/27/15 0517  NA 140 139 142  K 3.6 3.4* 3.6  CL 110 109 111  CO2 20* 19* 21*  BUN 20 19 20   CREATININE 0.98 1.01* 0.83  GLUCOSE 113* 116* 92   Electrolytes  Recent Labs Lab 03/25/15 0400  03/26/15 03/26/15 0446 03/27/15 0517  CALCIUM 8.3*  < > 8.3* 8.3* 8.5*  MG 2.1  --   --  1.9 2.0  PHOS 4.2  --   --  4.3 3.1  < > = values  in this interval not displayed. Sepsis Markers  Recent Labs Lab March 31, 2015 1047  LATICACIDVEN 6.15*   ABG  Recent Labs Lab 03/31/15 1545 03/26/15 0956 03/27/15 0530  PHART 7.336* 7.327* 7.435  PCO2ART 38.4 32.8* 27.5*  PO2ART 295.0* 84.0 110*   Liver Enzymes  Recent Labs Lab 03/31/2015 1037 03/27/15 0517  AST 84* 129*  ALT 55* 57*  ALKPHOS 52 51  BILITOT 1.0 1.2  ALBUMIN 3.0* 2.7*   Cardiac Enzymes  Recent Labs Lab 03-31-15 1435 2015-03-31 2225 03/25/15 0400  TROPONINI 18.61* 40.99* 35.58*   Glucose  Recent Labs Lab 03/26/15 1130 03/26/15 1732 03/26/15 2057 03/27/15 0058 03/27/15 0453 03/27/15 0753   GLUCAP 81 118* 122* 98 94 76    CXR: pulm edema, ett wnl  ASSESSMENT / PLAN:  PULMONARY Acute respiratory failure post cardiac arrest  P:   abg reviewed, rate reduction SBT planned No extubation given neurostatus Consider lasix , was pos 1.3 liters and EF 10%, pulm edema on pcxe increased  CARDIOVASCULAR Cardiac arrest  Severe cardiomyopathy STEMI Cardiogenic shock P:  Cont vasopressors to maintain MAP >60 Tele Heparin, asa  RENAL Recurrent hypokalemia P:   K supp Lasix Chem in am   GASTROINTESTINAL Tolerating TF P:   SUP: IV PPI Follow for BM  HEMATOLOGIC Anemia, no overt bleding noted  P:  DVT px: full dose heparin Monitor CBC am Transfuse per usual ICU guidelines  INFECTIOUS R/o aspiration, hypothermia increased risk infection P:   Repeat pcxr Sputum 7/2>>> 7/2 unasyn>>>  ENDOCRINE Hyperglycemia  P:   Cont SSI  NEUROLOGIC Acute anoxic encephalopathy - likely severe  Myoclonus - likley poor neuro recovery P:   RASS goal: 0 May need Depakote, would prefer over versed drip Goal dc versed drip after depakote start eeg reviewed Head CT if able Appreciate neuro help  Appears as horrible prognosis  FAMILY  - Updates:  Daughter and family in room  - Inter-disciplinary family meet or Palliative Care meeting due by:  7/6   30 min   Mcarthur Rossetti. Tyson Alias, MD, FACP Pgr: [phone] [facility]

## 2015-03-27 NOTE — Progress Notes (Signed)
eLink Physician-Brief Progress Note Patient Name: Anna Kemp DOB: Jan 13, 1966 MRN: [id number]  eLink Physician Progress Note and Electrolyte Replacement  Patient Name: Anna Kemp DOB: 1966/05/06 MRN: [id number]  Date of Service  03/27/2015   HPI/Events of Note    Recent Labs Lab 2015/04/08 1037  03/25/15 0400  03/25/15 1625 03/25/15 2041 03/26/15 03/26/15 0446 03/27/15 0517  NA 138  < > 141  < > 138 141 140 139 142  K 3.1*  < > 4.1  < > 3.5 3.7 3.6 3.4* 3.6  CL 105  < > 111  < > 108 112* 110 109 111  CO2 18*  < > 22  < > 20* 21* 20* 19* 21*  GLUCOSE 265*  < > 137*  < > 128* 115* 113* 116* 92  BUN 18  < > 20  < > 20 19 20 19 20   CREATININE 1.36*  < > 1.05*  < > 0.93 0.94 0.98 1.01* 0.83  CALCIUM 7.6*  < > 8.3*  < > 8.4* 8.5* 8.3* 8.3* 8.5*  MG 2.0  --  2.1  --   --   --   --  1.9 2.0  PHOS  --   --  4.2  --   --   --   --  4.3 3.1  < > = values in this interval not displayed.  Estimated Creatinine Clearance: 95.7 mL/min (by C-G formula based on Cr of 0.83).  Intake/Output      07/02 0701 - 07/03 0700   I.V. (mL/kg) 953.6 (11)   NG/GT 730   IV Piggyback 660   Total Intake(mL/kg) 2343.6 (27.1)   Urine (mL/kg/hr) 1110 (0.5)   Total Output 1110   Net +1233.6        - I/O DETAILED x 24h    Total I/O In: 1275.7 [I.V.:490.7; NG/GT:480; IV Piggyback:305] Out: 450 [Urine:450] - I/O THIS SHIFT    ASSESSMENT Mild hypokalemia  eICURN Interventions  kcl via tube   ASSESSMENT: MAJOR ELECTROLYTE      Dr. Kalman Shan, M.D., [facility].C.P Pulmonary and Critical Care Medicine Staff Physician [place]  Pulmonary and Critical Care Pager: [phone], If no answer or between  15:00h - 7:00h: call [phone]  03/27/2015 6:48 AM        Intervention Category Intermediate Interventions: Electrolyte abnormality - evaluation and management  Anna Kemp 03/27/2015, 6:48 AM

## 2015-03-27 NOTE — Progress Notes (Addendum)
Portable  CT scanner is broken . Message sent to neuro MD Dr. Hosie Poisson to make him aware.

## 2015-03-27 NOTE — Progress Notes (Signed)
ANTICOAGULATION CONSULT NOTE - Follow Up Consult  Pharmacy Consult for heparin Indication: chest pain/ACS  Not on File  Patient Measurements: Height:  (170.2 cm) Weight: 190 lb 15.1 oz (86.61 kg) (89.0 - 2.39) IBW/kg (Calculated) : 61.6 Heparin Dosing Weight: 78kg  Vital Signs: Temp: 97.7 F (36.5 C) (07/03 0603) Temp Source: Core (Comment) (07/03 0603) BP: 114/77 mmHg (07/03 0603) Pulse Rate: 106 (07/03 0435)  Labs:  Recent Labs  03/25/2015 1037  03/29/2015 1435  04/24/2015 1839 04/03/2015 1842  Apr 02, 2015 2225  03/25/15 0400  03/25/15 2041  03/26/15  03/26/15 0446 03/26/15 1033 03/26/15 2100 03/27/15 0517 03/27/15 0518  HGB 8.9*  < >  --   < >  --  12.2  --   --   --  9.6*  --   --   --   --   --  9.3*  --   --   --   --   HCT 32.7*  < >  --   < >  --  36.0  --   --   --  34.2*  --   --   --   --   --  32.9*  --   --   --   --   PLT 268  --   --   --  289  --   --   --   --  291  --   --   --   --   --  315  --   --   --   --   APTT 26  --   --   --  36  --   --   --   --   --   --  102*  --   --   --   --   --   --   --   --   LABPROT 17.4*  --   --   --  19.1*  --   --  22.1*  --   --   --   --   --   --   --   --   --   --   --   --   INR 1.41  --   --   --  1.60*  --   --  1.95*  --   --   --   --   --   --   --   --   --   --   --   --   HEPARINUNFRC  --   --   --   --   --   --   --   --   < >  --   < >  --   < >  --   < >  --  <0.10* 0.29*  --  0.29*  CREATININE 1.36*  < > 1.40*  < >  --  1.10*  < >  --   < > 1.05*  < > 0.94  --  0.98  --  1.01*  --   --  0.83  --   TROPONINI  --   --  18.61*  --   --   --   --  40.99*  --  35.58*  --   --   --   --   --   --   --   --   --   --   < > =  values in this interval not displayed.  Estimated Creatinine Clearance: 95.7 mL/min (by C-G formula based on Cr of 0.83).  Assessment: 23 YOF s/p VF arrest and CODE STEM. S/p hypothermia protocol. Heparin level remains slightly subtherapeutic (0.29) on 1550 units/hr. No issue with  infusion per RN. No bleeding noted.  Goal of Therapy:  Heparin level 0.3-0.7 units/ml Monitor platelets by anticoagulation protocol: Yes   Plan:  1. Increase heparin to 1700 units/hr  2. Will f/u 6 hr heparin level  Christoper Fabian, PharmD, BCPS Clinical pharmacist, pager [phone] 03/27/2015 6:06 AM

## 2015-03-27 NOTE — Progress Notes (Signed)
Dr. Sherene Sires notified of head CT results. No new orders at this time.

## 2015-03-27 NOTE — Progress Notes (Signed)
Made vent setting changes per MD order, RN aware.  Pt tol well so far.

## 2015-03-27 NOTE — Progress Notes (Signed)
Patient transported to CT and back on ventilator without complications. 

## 2015-03-28 ENCOUNTER — Inpatient Hospital Stay (HOSPITAL_COMMUNITY): Payer: BLUE CROSS/BLUE SHIELD

## 2015-03-28 LAB — CBC WITH DIFFERENTIAL/PLATELET
BASOS ABS: 0 10*3/uL (ref 0.0–0.1)
Basophils Relative: 0 % (ref 0–1)
Eosinophils Absolute: 0 10*3/uL (ref 0.0–0.7)
Eosinophils Relative: 0 % (ref 0–5)
HCT: 28.6 % — ABNORMAL LOW (ref 36.0–46.0)
HEMOGLOBIN: 8 g/dL — AB (ref 12.0–15.0)
LYMPHS ABS: 1.2 10*3/uL (ref 0.7–4.0)
LYMPHS PCT: 4 % — AB (ref 12–46)
MCH: 19.2 pg — ABNORMAL LOW (ref 26.0–34.0)
MCHC: 28 g/dL — ABNORMAL LOW (ref 30.0–36.0)
MCV: 68.6 fL — ABNORMAL LOW (ref 78.0–100.0)
MONO ABS: 1.8 10*3/uL — AB (ref 0.1–1.0)
Monocytes Relative: 6 % (ref 3–12)
NEUTROS ABS: 27.1 10*3/uL — AB (ref 1.7–7.7)
NEUTROS PCT: 90 % — AB (ref 43–77)
Platelets: 272 10*3/uL (ref 150–400)
RBC: 4.17 MIL/uL (ref 3.87–5.11)
RDW: 20.7 % — ABNORMAL HIGH (ref 11.5–15.5)
WBC: 30.1 10*3/uL — ABNORMAL HIGH (ref 4.0–10.5)

## 2015-03-28 LAB — URINALYSIS, ROUTINE W REFLEX MICROSCOPIC
Bilirubin Urine: NEGATIVE
Glucose, UA: NEGATIVE mg/dL
KETONES UR: 15 mg/dL — AB
Leukocytes, UA: NEGATIVE
Nitrite: NEGATIVE
Protein, ur: NEGATIVE mg/dL
Specific Gravity, Urine: 1.019 (ref 1.005–1.030)
UROBILINOGEN UA: 1 mg/dL (ref 0.0–1.0)
pH: 6 (ref 5.0–8.0)

## 2015-03-28 LAB — COMPREHENSIVE METABOLIC PANEL
ALBUMIN: 2.6 g/dL — AB (ref 3.5–5.0)
ALT: 46 U/L (ref 14–54)
AST: 94 U/L — AB (ref 15–41)
Alkaline Phosphatase: 53 U/L (ref 38–126)
Anion gap: 10 (ref 5–15)
BUN: 22 mg/dL — AB (ref 6–20)
CALCIUM: 8.5 mg/dL — AB (ref 8.9–10.3)
CO2: 24 mmol/L (ref 22–32)
CREATININE: 0.89 mg/dL (ref 0.44–1.00)
Chloride: 108 mmol/L (ref 101–111)
GFR calc Af Amer: >60 mL/min (ref >60)
GLUCOSE: 140 mg/dL — AB (ref 65–99)
Potassium: 3.2 mmol/L — ABNORMAL LOW (ref 3.5–5.1)
SODIUM: 142 mmol/L (ref 135–145)
Total Bilirubin: 1.2 mg/dL (ref 0.3–1.2)
Total Protein: 6.1 g/dL — ABNORMAL LOW (ref 6.5–8.1)

## 2015-03-28 LAB — URINE MICROSCOPIC-ADD ON

## 2015-03-28 LAB — GLUCOSE, CAPILLARY
GLUCOSE-CAPILLARY: 119 mg/dL — AB (ref 65–99)
GLUCOSE-CAPILLARY: 92 mg/dL (ref 65–99)
Glucose-Capillary: 113 mg/dL — ABNORMAL HIGH (ref 65–99)
Glucose-Capillary: 133 mg/dL — ABNORMAL HIGH (ref 65–99)
Glucose-Capillary: 140 mg/dL — ABNORMAL HIGH (ref 65–99)
Glucose-Capillary: 146 mg/dL — ABNORMAL HIGH (ref 65–99)

## 2015-03-28 LAB — HEPARIN LEVEL (UNFRACTIONATED): Heparin Unfractionated: 0.53 IU/mL (ref 0.30–0.70)

## 2015-03-28 LAB — CULTURE, RESPIRATORY

## 2015-03-28 LAB — CULTURE, RESPIRATORY W GRAM STAIN: Special Requests: NORMAL

## 2015-03-28 LAB — PHOSPHORUS: Phosphorus: 2.7 mg/dL (ref 2.5–4.6)

## 2015-03-28 LAB — MAGNESIUM: MAGNESIUM: 1.8 mg/dL (ref 1.7–2.4)

## 2015-03-28 MED ORDER — LORAZEPAM 2 MG/ML IJ SOLN
2.0000 mg | Freq: Four times a day (QID) | INTRAMUSCULAR | Status: DC
  Administered 2015-03-28 – 2015-03-31 (×14): 2 mg via INTRAVENOUS
  Filled 2015-03-28 (×14): qty 1

## 2015-03-28 MED ORDER — MIDAZOLAM HCL 2 MG/2ML IJ SOLN
2.0000 mg | INTRAMUSCULAR | Status: DC | PRN
  Administered 2015-03-28 – 2015-03-30 (×3): 2 mg via INTRAVENOUS
  Filled 2015-03-28 (×3): qty 2

## 2015-03-28 MED ORDER — MAGNESIUM SULFATE 2 GM/50ML IV SOLN
2.0000 g | Freq: Once | INTRAVENOUS | Status: AC
  Administered 2015-03-28: 2 g via INTRAVENOUS
  Filled 2015-03-28: qty 50

## 2015-03-28 MED ORDER — POTASSIUM CHLORIDE 20 MEQ/15ML (10%) PO SOLN
30.0000 meq | ORAL | Status: AC
  Administered 2015-03-28 (×2): 30 meq
  Filled 2015-03-28 (×2): qty 30

## 2015-03-28 NOTE — Progress Notes (Signed)
ANTICOAGULATION CONSULT NOTE - Follow Up Consult  Pharmacy Consult for heparin Indication: chest pain/ACS  Labs:  Recent Labs  03/25/15 2041  03/26/15 0446  03/27/15 0517 03/27/15 0518 03/27/15 1245 03/28/15 0430  HGB  --   < > 9.3*  --  8.3*  --   --  8.0*  HCT  --   --  32.9*  --  29.6*  --   --  28.6*  PLT  --   --  315  --  300  --   --  272  APTT 102*  --   --   --   --   --   --   --   HEPARINUNFRC  --   < >  --   < >  --  0.29* 0.55 0.53  CREATININE 0.94  < > 1.01*  --  0.83  --   --  0.89  < > = values in this interval not displayed.  Estimated Creatinine Clearance: 88 mL/min (by C-G formula based on Cr of 0.89).  Assessment: 49 yo F presents on 6/30 post Vfib arrest. Approximately 25 minutes of CPR. Evaluated in ER and code STEMI called. Taken to cath lab. Hypothermia protocol initiated.  PMH: Cardiomyopathy  Pharmacy consulted to [facility] heparin for extensive LAD thrombus.  Anticoagulation: STEMI s/p cath w/ extensive thrombectomy (bival and tirofiban in cath, no stent). Per Dr. Excell Seltzer plans for heparin due to extent of LAD occlusion; thought to be embolic  CBC stable, no bleeding noted On: Heparin drip @ 1700/hr  Goal of Therapy:  Heparin level 0.3-0.7 units/ml Monitor platelets by anticoagulation protocol: Yes   Plan:  1. Continue heparin to 1700 units/hr  2. Will f/u AM heparin level  Daily HL, CBC  Thank you for allowing pharmacy to be a part of this patients care team.  Lovenia Kim Pharm.D., BCPS, AQ-Cardiology Clinical Pharmacist 03/28/2015 8:21 AM Pager: [phone] Phone: [phone]

## 2015-03-28 NOTE — Progress Notes (Signed)
[facility] ADULT ICU REPLACEMENT PROTOCOL FOR AM LAB REPLACEMENT ONLY  The patient does apply for the [facility] Adult ICU Electrolyte Replacment Protocol based on the criteria listed below:   1. Is GFR >/= 40 ml/min? Yes.    Patient's GFR today is >60 2. Is urine output >/= 0.5 ml/kg/hr for the last 6 hours? Yes.   Patient's UOP is 0.82 ml/kg/hr 3. Is BUN < 60 mg/dL? Yes.    Patient's BUN today is 22 4. Abnormal electrolyte(s): Potassium 3.2, Magnesium 1.8 5. Ordered repletion with: [facility] adult ICU replacement protocol 6. If a panic level lab has been reported, has the CCM MD in charge been notified? Yes.  .   Physician:  Dr. Loletha Grayer  [facility], Alda Berthold E 03/28/2015 6:07 AM

## 2015-03-28 NOTE — Progress Notes (Signed)
PULMONARY / CRITICAL CARE MEDICINE   Name: Storey Stangeland MRN: [id number] DOB: [birth year]    ADMISSION DATE:  03/19/2015  REFERRING MD :  EDP  CHIEF COMPLAINT:  Post arrest   INITIAL PRESENTATION:  49yo female with hx cardiomyopathy presented 6/30 after VFib arrest.  Approx 25 minutes CPR.  Evaluated in ER and code STEMI called, pt taken emergently to cath lab.  PCCM called to admit for hypothermia protocol.   MAJOR EVENTS/TEST RESULTS: 6/30 Admitted to CCU after prolonged cardiac arrest. Hypothermia protocol initiated 6/30 CT head: NAICP 6/30 LHC: Normal coronary arteries except for thrombotic apical occlusion of LAD. Very severe global LV systolic dysfunction with LVEF 10%. Unsuccessful PCI of the apical LAD with attempted aspiration thrombectomy and PTCA. 7/01 EEG: consistent with a profound generalized cerebral dysfunction(encephalopathy) as can be seen with hypoxic encephalopathy. The presence of sedating medication and hypothermia does make this recording less definite for prognostication. There was no seizure or seizure predisposition recorded on this study 7/01 TTE: LVEF 15%. Grade 2 DD 7/2- myoclonus 7/2 eeg-Abnormal EEG due to the presence of a profound generalized cerebral dysfunction (encephalopathy) which can be seen with hypoxic encephalopathy 7/3 CT head>>>mild loss of gray-white differentiation within both cerebral hemispheres, though this is still partially preserved. This raises concern for some degree of anoxic brain injury. Associated decreased attenuation at the caudate bilaterally. Mild mass effect  INDWELLING DEVICES:: ETT 6/30 >>  L [place] CVL 6/30 >>   MICRO DATA: Urine 6/30 >>  Blood 6/30 >>   ANTIMICROBIALS:    SUBJECTIVE:  Neg 1 liter, CT head anoxia  VITAL SIGNS: Temp:  [97.1 F (36.2 C)-100.2 F (37.9 C)] 98.6 F (37 C) (07/04 0400) Pulse Rate:  [101-124] 108 (07/04 0700) Resp:  [22-39] 22 (07/04 0700) BP: (99-132)/(59-84) 106/77 mmHg (07/04  0700) SpO2:  [92 %-100 %] 100 % (07/04 0700) FiO2 (%):  [30 %-40 %] 40 % (07/04 0400) Weight:  [84 kg (185 lb 3 oz)] 84 kg (185 lb 3 oz) (07/04 0500) HEMODYNAMICS: CVP:  [5 mmHg-20 mmHg] 14 mmHg VENTILATOR SETTINGS: Vent Mode:  [-] PRVC FiO2 (%):  [30 %-40 %] 40 % Set Rate:  [16 bmp-20 bmp] 16 bmp Vt Set:  [500 mL] 500 mL PEEP:  [5 cmH20] 5 cmH20 Plateau Pressure:  [20 cmH20-22 cmH20] 20 cmH20 INTAKE / OUTPUT:  Intake/Output Summary (Last 24 hours) at 03/28/15 0757 Last data filed at 03/28/15 0700  Gross per 24 hour  Intake 2889.42 ml  Output   3910 ml  Net -1020.58 ml    PHYSICAL EXAMINATION: General: intubated, myoclonus reduced, moved upper ext to pain Neuro: PERRL 18m sluggish, gag with suctioning, not follow commmands HEENT: jvd same Cardiovascular: s1 s2 TRR Lungs: rochi insp mild, less coarse Abdomen:  Soft, BS wnl, no r/g Ext: cool, no edema    LABS:  CBC  Recent Labs Lab 03/26/15 0446 03/27/15 0517 03/28/15 0430  WBC 24.3* 23.6* 30.1*  HGB 9.3* 8.3* 8.0*  HCT 32.9* 29.6* 28.6*  PLT 315 300 272   Coag's  Recent Labs Lab 03/21/2015 1037 03/20/2015 1839 03/17/2015 2225 03/25/15 2041  APTT 26 36  --  102*  INR 1.41 1.60* 1.95*  --    BMET  Recent Labs Lab 03/26/15 0446 03/27/15 0517 03/28/15 0430  NA 139 142 142  K 3.4* 3.6 3.2*  CL 109 111 108  CO2 19* 21* 24  BUN 19 20 22*  CREATININE 1.01* 0.83 0.89  GLUCOSE 116* 92 140*  Electrolytes  Recent Labs Lab 03/26/15 0446 03/27/15 0517 03/28/15 0430  CALCIUM 8.3* 8.5* 8.5*  MG 1.9 2.0 1.8  PHOS 4.3 3.1 2.7   Sepsis Markers  Recent Labs Lab 03/05/2015 1047  LATICACIDVEN 6.15*   ABG  Recent Labs Lab 03/10/2015 1545 03/26/15 0956 03/27/15 0530  PHART 7.336* 7.327* 7.435  PCO2ART 38.4 32.8* 27.5*  PO2ART 295.0* 84.0 110*   Liver Enzymes  Recent Labs Lab 03/09/2015 1037 03/27/15 0517 03/28/15 0430  AST 84* 129* 94*  ALT 55* 57* 46  ALKPHOS 52 51 53  BILITOT 1.0 1.2 1.2   ALBUMIN 3.0* 2.7* 2.6*   Cardiac Enzymes  Recent Labs Lab 02/24/2015 1435 03/12/2015 2225 03/25/15 0400  TROPONINI 18.61* 40.99* 35.58*   Glucose  Recent Labs Lab 03/27/15 0753 03/27/15 1150 03/27/15 1526 03/27/15 2014 03/28/15 0014 03/28/15 0423  GLUCAP 76 95 86 102* 113* 146*    CXR: pulm edema improved, ett wnl  ASSESSMENT / PLAN:  PULMONARY Acute respiratory failure post cardiac arrest  P:   Neg 1 liter, maintain Keep same MV Wean cpap 5 ps 5, goal 2 hr,  likely needs trach, family wants to pursue   CARDIOVASCULAR Cardiac arrest  Severe cardiomyopathy STEMI Cardiogenic shock P:  Tele Heparin, asa  RENAL Recurrent hypokalemia, pulm edema, ef 10% P:   K supp Lasix maintain, tolerated well Chem in am   GASTROINTESTINAL Tolerating TF P:   SUP: IV PPI  HEMATOLOGIC Anemia, no overt bleding noted  P:  DVT px: full dose heparin Monitor CBC am Transfuse per usual ICU guidelines  INFECTIOUS R/o aspiration, hypothermia increased risk infection, hemoconcentration affect WBC  P:   Repeat pcxr Sputum 7/2>>>abundant H flu 7/2 unasyn>>>  If spike, add vanc Follow wbc, further pcxr is improved with lasix UA send  ENDOCRINE Hyperglycemia  P:   Cont SSI  NEUROLOGIC Acute anoxic encephalopathy - likely severe  Myoclonus - likley poor neuro recovery P:   RASS goal: 0 Goal dc versed drip, not met, restarted add ativan tube Depakote, may nee dincrease Head CT reviewed, anoxia present Opened eyes, family likely iwll want to pursue support longterm, likely in need trach   FAMILY  - Updates:  Daughter and family in room updated daily  - Inter-disciplinary family meet or Palliative Care meeting due by:  7/6   30 min   Lavon Paganini. Titus Mould, MD, [place] Pgr: [facility]

## 2015-03-29 ENCOUNTER — Inpatient Hospital Stay (HOSPITAL_COMMUNITY): Payer: BLUE CROSS/BLUE SHIELD

## 2015-03-29 DIAGNOSIS — R570 Cardiogenic shock: Secondary | ICD-10-CM

## 2015-03-29 DIAGNOSIS — G931 Anoxic brain damage, not elsewhere classified: Secondary | ICD-10-CM

## 2015-03-29 LAB — CBC WITH DIFFERENTIAL/PLATELET
Basophils Absolute: 0 10*3/uL (ref 0.0–0.1)
Basophils Relative: 0 % (ref 0–1)
EOS PCT: 0 % (ref 0–5)
Eosinophils Absolute: 0 10*3/uL (ref 0.0–0.7)
HEMATOCRIT: 30.3 % — AB (ref 36.0–46.0)
Hemoglobin: 8.4 g/dL — ABNORMAL LOW (ref 12.0–15.0)
Lymphocytes Relative: 8 % — ABNORMAL LOW (ref 12–46)
Lymphs Abs: 1.8 10*3/uL (ref 0.7–4.0)
MCH: 19.5 pg — ABNORMAL LOW (ref 26.0–34.0)
MCHC: 27.7 g/dL — ABNORMAL LOW (ref 30.0–36.0)
MCV: 70.5 fL — ABNORMAL LOW (ref 78.0–100.0)
MONOS PCT: 8 % (ref 3–12)
Monocytes Absolute: 1.8 10*3/uL — ABNORMAL HIGH (ref 0.1–1.0)
NEUTROS PCT: 84 % — AB (ref 43–77)
Neutro Abs: 18.8 10*3/uL — ABNORMAL HIGH (ref 1.7–7.7)
PLATELETS: 302 10*3/uL (ref 150–400)
RBC: 4.3 MIL/uL (ref 3.87–5.11)
RDW: 21 % — ABNORMAL HIGH (ref 11.5–15.5)
WBC: 22.4 10*3/uL — AB (ref 4.0–10.5)

## 2015-03-29 LAB — COMPREHENSIVE METABOLIC PANEL
ALBUMIN: 2.6 g/dL — AB (ref 3.5–5.0)
ALT: 38 U/L (ref 14–54)
ANION GAP: 10 (ref 5–15)
AST: 77 U/L — AB (ref 15–41)
Alkaline Phosphatase: 51 U/L (ref 38–126)
BUN: 25 mg/dL — ABNORMAL HIGH (ref 6–20)
CO2: 26 mmol/L (ref 22–32)
Calcium: 9.1 mg/dL (ref 8.9–10.3)
Chloride: 111 mmol/L (ref 101–111)
Creatinine, Ser: 1.07 mg/dL — ABNORMAL HIGH (ref 0.44–1.00)
GFR calc non Af Amer: >60 mL/min (ref >60)
Glucose, Bld: 130 mg/dL — ABNORMAL HIGH (ref 65–99)
POTASSIUM: 3.6 mmol/L (ref 3.5–5.1)
SODIUM: 147 mmol/L — AB (ref 135–145)
TOTAL PROTEIN: 6.7 g/dL (ref 6.5–8.1)
Total Bilirubin: 0.9 mg/dL (ref 0.3–1.2)

## 2015-03-29 LAB — CARBOXYHEMOGLOBIN
CARBOXYHEMOGLOBIN: 1.7 % — AB (ref 0.5–1.5)
Carboxyhemoglobin: 2.2 % — ABNORMAL HIGH (ref 0.5–1.5)
Methemoglobin: 1.3 % (ref 0.0–1.5)
Methemoglobin: 0.8 % (ref 0.0–1.5)
O2 Saturation: 87.1 %
O2 Saturation: 73.9 %
Total hemoglobin: 6.9 g/dL — CL (ref 12.0–16.0)
Total hemoglobin: 8.9 g/dL — ABNORMAL LOW (ref 12.0–16.0)

## 2015-03-29 LAB — CULTURE, BLOOD (ROUTINE X 2)
Culture: NO GROWTH
Culture: NO GROWTH

## 2015-03-29 LAB — TSH: TSH: 0.273 u[IU]/mL — ABNORMAL LOW (ref 0.350–4.500)

## 2015-03-29 LAB — GLUCOSE, CAPILLARY
GLUCOSE-CAPILLARY: 129 mg/dL — AB (ref 65–99)
Glucose-Capillary: 119 mg/dL — ABNORMAL HIGH (ref 65–99)
Glucose-Capillary: 123 mg/dL — ABNORMAL HIGH (ref 65–99)
Glucose-Capillary: 130 mg/dL — ABNORMAL HIGH (ref 65–99)
Glucose-Capillary: 130 mg/dL — ABNORMAL HIGH (ref 65–99)

## 2015-03-29 LAB — T4, FREE: FREE T4: 1.71 ng/dL — AB (ref 0.61–1.12)

## 2015-03-29 LAB — MAGNESIUM: Magnesium: 2.2 mg/dL (ref 1.7–2.4)

## 2015-03-29 LAB — HEPARIN LEVEL (UNFRACTIONATED): HEPARIN UNFRACTIONATED: 0.51 [IU]/mL (ref 0.30–0.70)

## 2015-03-29 MED ORDER — POTASSIUM CHLORIDE 20 MEQ/15ML (10%) PO SOLN
40.0000 meq | Freq: Every day | ORAL | Status: DC
  Administered 2015-03-30 – 2015-03-31 (×2): 40 meq
  Filled 2015-03-29 (×2): qty 30

## 2015-03-29 MED ORDER — LISINOPRIL 2.5 MG PO TABS
2.5000 mg | ORAL_TABLET | Freq: Two times a day (BID) | ORAL | Status: DC
  Administered 2015-03-29 (×2): 2.5 mg
  Filled 2015-03-29 (×5): qty 1

## 2015-03-29 MED ORDER — SPIRONOLACTONE 12.5 MG HALF TABLET
12.5000 mg | ORAL_TABLET | Freq: Every day | ORAL | Status: DC
  Administered 2015-03-29: 12.5 mg
  Filled 2015-03-29 (×3): qty 1

## 2015-03-29 MED ORDER — POTASSIUM CHLORIDE 20 MEQ/15ML (10%) PO SOLN
40.0000 meq | Freq: Once | ORAL | Status: AC
  Administered 2015-03-29: 40 meq
  Filled 2015-03-29: qty 30

## 2015-03-29 MED ORDER — ATORVASTATIN CALCIUM 40 MG PO TABS
40.0000 mg | ORAL_TABLET | Freq: Every day | ORAL | Status: DC
  Administered 2015-03-29 – 2015-03-30 (×2): 40 mg
  Filled 2015-03-29 (×3): qty 1

## 2015-03-29 MED ORDER — FUROSEMIDE 10 MG/ML IJ SOLN
40.0000 mg | Freq: Two times a day (BID) | INTRAMUSCULAR | Status: DC
  Administered 2015-03-29 (×2): 40 mg via INTRAVENOUS
  Filled 2015-03-29 (×4): qty 4

## 2015-03-29 NOTE — Progress Notes (Signed)
PULMONARY / CRITICAL CARE MEDICINE   Name: Anna Kemp MRN: [id number] DOB: [birth year]    ADMISSION DATE:  02/24/2015  REFERRING MD :  EDP  CHIEF COMPLAINT:  Post arrest   INITIAL PRESENTATION:  49yo female with hx cardiomyopathy presented 6/30 after VFib arrest.  Approx 25 minutes CPR.  Evaluated in ER and code STEMI called, pt taken emergently to cath lab.  PCCM called to admit for hypothermia protocol.   INDWELLING DEVICES:: ETT 6/30 >>  L [place] CVL 6/30 >>   MICRO DATA: Urine 6/30 >>  Blood 6/30 >>   ANTIMICROBIALS:     MAJOR EVENTS/TEST RESULTS: 6/30 Admitted to CCU after prolonged cardiac arrest. Hypothermia protocol initiated 6/30 CT head: NAICP 6/30 LHC: Normal coronary arteries except for thrombotic apical occlusion of LAD. Very severe global LV systolic dysfunction with LVEF 10%. Unsuccessful PCI of the apical LAD with attempted aspiration thrombectomy and PTCA. 7/01 EEG: consistent with a profound generalized cerebral dysfunction(encephalopathy) as can be seen with hypoxic encephalopathy. The presence of sedating medication and hypothermia does make this recording less definite for prognostication. There was no seizure or seizure predisposition recorded on this study 7/01 TTE: LVEF 15%. Grade 2 DD 7/2- myoclonus 7/2 eeg-Abnormal EEG due to the presence of a profound generalized cerebral dysfunction (encephalopathy) which can be seen with hypoxic encephalopathy 7/3 CT head>>>mild loss of gray-white differentiation within both cerebral hemispheres, though this is still partially preserved. This raises concern for some degree of anoxic brain injury. Associated decreased attenuation at the caudate bilaterally. Mild mass effect 03/28/15: Neg 1 liter, CT head anoxia   SUBJECTIVE/OVERNIGHT/INTERVAL HX 03/29/15: Comatose. Pupil+, Gag  +. Dr Tyson Alias to trach 03/31/15 Thursday.Family at bedside - crying . Cards recommending CHF Service  VITAL SIGNS: Temp:  [98.1 F (36.7  C)-100.2 F (37.9 C)] 98.9 F (37.2 C) (07/05 0800) Pulse Rate:  [41-124] 113 (07/05 0800) Resp:  [19-49] 26 (07/05 0800) BP: (94-153)/(53-106) 130/86 mmHg (07/05 0800) SpO2:  [97 %-100 %] 100 % (07/05 0800) FiO2 (%):  [40 %] 40 % (07/05 0800) Weight:  [83.1 kg (183 lb 3.2 oz)] 83.1 kg (183 lb 3.2 oz) (07/05 0438) HEMODYNAMICS: CVP:  [3 mmHg-14 mmHg] 10 mmHg VENTILATOR SETTINGS: Vent Mode:  [-] PRVC FiO2 (%):  [40 %] 40 % Set Rate:  [16 bmp] 16 bmp Vt Set:  [500 mL] 500 mL PEEP:  [5 cmH20] 5 cmH20 Pressure Support:  [10 cmH20] 10 cmH20 Plateau Pressure:  [17 cmH20-20 cmH20] 17 cmH20 INTAKE / OUTPUT:  Intake/Output Summary (Last 24 hours) at 03/29/15 7106 Last data filed at 03/29/15 0800  Gross per 24 hour  Intake   2839 ml  Output   5105 ml  Net  -2266 ml    PHYSICAL EXAMINATION: General: Looks critically ill Neuro:COmatose . Gag +,. Pupil + , not follow commmands HEENT: jvd same Cardiovascular: s1 s2 TRR Lungs: rochi insp mild, less coarse Abdomen:  Soft, BS wnl, no r/g Ext: cool, no edema    LABS: PULMONARY  Recent Labs Lab 03/03/2015 1128  03/15/2015 1545 03/07/2015 1638 03/03/2015 1842 03/26/15 0956 03/27/15 0530  PHART 7.243*  --  7.336*  --   --  7.327* 7.435  PCO2ART 45.2*  --  38.4  --   --  32.8* 27.5*  PO2ART 135.0*  --  295.0*  --   --  84.0 110*  HCO3 19.6*  --  20.9  --   --  17.2* 18.3*  TCO2 21  < > 22 19  20 18 19.1  O2SAT 99.0  --  100.0  --   --  96.0 98.8  < > = values in this interval not displayed.  CBC  Recent Labs Lab 03/27/15 0517 03/28/15 0430 03/29/15 0453  HGB 8.3* 8.0* 8.4*  HCT 29.6* 28.6* 30.3*  WBC 23.6* 30.1* 22.4*  PLT 300 272 302    COAGULATION  Recent Labs Lab 03/18/2015 1037 03/12/2015 1839 03/05/2015 2225  INR 1.41 1.60* 1.95*    CARDIAC   Recent Labs Lab 03/22/2015 1435 03/20/2015 2225 03/25/15 0400  TROPONINI 18.61* 40.99* 35.58*   No results for input(s): PROBNP in the last 168  hours.   CHEMISTRY  Recent Labs Lab 03/25/15 0400  03/26/15 03/26/15 0446 03/27/15 0517 03/28/15 0430 03/29/15 0453  NA 141  < > 140 139 142 142 147*  K 4.1  < > 3.6 3.4* 3.6 3.2* 3.6  CL 111  < > 110 109 111 108 111  CO2 22  < > 20* 19* 21* 24 26  GLUCOSE 137*  < > 113* 116* 92 140* 130*  BUN 20  < > 20 19 20  22* 25*  CREATININE 1.05*  < > 0.98 1.01* 0.83 0.89 1.07*  CALCIUM 8.3*  < > 8.3* 8.3* 8.5* 8.5* 9.1  MG 2.1  --   --  1.9 2.0 1.8 2.2  PHOS 4.2  --   --  4.3 3.1 2.7  --   < > = values in this interval not displayed. Estimated Creatinine Clearance: 72.8 mL/min (by C-G formula based on Cr of 1.07).   LIVER  Recent Labs Lab 02/26/2015 1037 02/27/2015 1839 03/12/2015 2225 03/27/15 0517 03/28/15 0430 03/29/15 0453  AST 84*  --   --  129* 94* 77*  ALT 55*  --   --  57* 46 38  ALKPHOS 52  --   --  51 53 51  BILITOT 1.0  --   --  1.2 1.2 0.9  PROT 6.1*  --   --  5.9* 6.1* 6.7  ALBUMIN 3.0*  --   --  2.7* 2.6* 2.6*  INR 1.41 1.60* 1.95*  --   --   --      INFECTIOUS  Recent Labs Lab 03/17/2015 1047  LATICACIDVEN 6.15*     ENDOCRINE CBG (last 3)   Recent Labs  03/29/15 0005 03/29/15 0446 03/29/15 0813  GLUCAP 119* 129* 123*         IMAGING x48h  - image(s) personally visualized  -   highlighted in bold Ct Head Wo Contrast  03/27/2015   CLINICAL DATA:  Acute onset of anoxia.  Initial encounter.  EXAM: CT HEAD WITHOUT CONTRAST  TECHNIQUE: Contiguous axial images were obtained from the base of the skull through the vertex without intravenous contrast.  COMPARISON:  CT of the head performed 03/18/2015  FINDINGS: There is suggestion of mild loss of gray-white differentiation within the cerebral hemispheres, though this is still partially preserved. The cerebellar hemispheres appear grossly unremarkable. Decreased attenuation at the caudate bilaterally could reflect ischemic injury.  Underlying mild mass effect is noted, without evidence of midline shift. The  third and lateral ventricles are grossly unremarkable. The fourth ventricle is within normal limits.  There is no evidence of fracture; visualized osseous structures are unremarkable in appearance. The orbits are within normal limits. The paranasal sinuses and mastoid air cells are well-aerated. No significant soft tissue abnormalities are seen.  IMPRESSION: Suggestion of mild loss of gray-white differentiation within both cerebral  hemispheres, though this is still partially preserved. This raises concern for some degree of anoxic brain injury. Associated decreased attenuation at the caudate bilaterally. Mild mass effect, without evidence of midline shift.  These results were called by telephone at the time of interpretation on 03/27/2015 at 10:30 pm to [facility] RN on [facility], who verbally acknowledged these results.   Electronically Signed   By: Roanna Raider M.D.   On: 03/27/2015 22:31   Dg Chest Port 1 View  03/29/2015   CLINICAL DATA:  Cardiac arrest.  EXAM: PORTABLE CHEST - 1 VIEW  COMPARISON:  03/28/2015.  FINDINGS: Endotracheal tube, left IJ line, NG tube in stable position. Cardiomegaly. Continued slight improvement of pulmonary interstitial edema. Left lower lobe atelectasis and/or consolidation. Small left pleural effusion cannot be excluded. No pneumothorax.  IMPRESSION: 1. Lines and tubes in stable position. 2. Cardiomegaly with continued slight interim improvement of pulmonary interstitial edema. Small left pleural effusion cannot be excluded. 3. Dense left lower lobe atelectasis and/or consolidation.   Electronically Signed   By: Maisie Fus  Register   On: 03/29/2015 07:12   Dg Chest Port 1 View  03/28/2015   CLINICAL DATA:  Pulmonary edema  EXAM: PORTABLE CHEST - 1 VIEW  COMPARISON:  03/27/2015  FINDINGS: Cardiomegaly with vascular congestion. Decreasing interstitial prominence, likely improving interstitial edema. Focal opacity in the left lower lobe could represent atelectasis or infiltrate.  Probable small left effusion.  Support devices are stable.  IMPRESSION: Improving interstitial edema pattern.  Continued left lower lobe atelectasis or infiltrate with probable small left effusion.   Electronically Signed   By: Charlett Nose M.D.   On: 03/28/2015 07:30        ASSESSMENT / PLAN:  PULMONARY Acute respiratory failure post cardiac arrest    - coma precludes extubation  P:   Full vent support  Likely trach 03/31/15 by feinstein   CARDIOVASCULAR Cardiac arrest  Severe cardiomyopathy STEMI Cardiogenic shock   - not on pressors. Cards back on board  P:  Tele Heparin gtt, asa  RENAL Recurrent hypokalemia, pulm edema, ef 10% P:   K supp Lasix maintain, tolerated well Chem in am   GASTROINTESTINAL Tolerating TF P:   SUP: IV PPI  HEMATOLOGIC Anemia, no overt bleding noted  P:  DVT px: full dose heparin Monitor CBC am Transfuse per usual ICU guidelines - hgb goal > 8gm%  INFECTIOUS H Flu pneumonia 03/26/15 (Urine tx positive amphetamines 03/11/2015)  P:   7/2 unasyn>>> (22-Apr-2015)    ENDOCRINE Hyperglycemia  P:   Cont SSI  NEUROLOGIC Acute anoxic encephalopathy - likely severe  Myoclonus - likley poor neuro recovery    P:   RASS goal: 0 ativan prn  + versed prn + Depakote Check neuron specific enolase  FAMILY  - Updates:  Daughter and family in room updated daily; Denied questions 03/29/2015   - Inter-disciplinary family meet or Palliative Care meeting due by:  7/6      The patient is critically ill with multiple organ systems failure and requires high complexity decision making for assessment and support, frequent evaluation and titration of therapies, application of advanced monitoring technologies and extensive interpretation of multiple databases.   Critical Care Time devoted to patient care services described in this note is  30  Minutes. This time reflects time of care of this signee Dr Kalman Shan. This critical care time does  not reflect procedure time, or teaching time or supervisory time of PA/NP/Med student/Med Resident etc but  could involve care discussion time    Dr. Kalman Shan, M.D., [facility].C.P Pulmonary and Critical Care Medicine Staff Physician [facility] [place] [facility] Pager: [phone], If no answer or between  15:00h - 7:00h: call [phone]  03/29/2015 9:45 AM

## 2015-03-29 NOTE — Progress Notes (Signed)
Advanced Heart Failure Rounding Note  Primary Cardiologist: She had a cardiologist in [place] but had not been following up 2/2 to financial problems.  Pt Profile: Anna Kemp is a 49 y.o. female with hx cardiomyopathy presented from home on 6/30 after VFib arrest. Per her 60 yo daughter she was complaining of chest burning and SOB. She collapsed and became unresponsive. . EMS found her in vfib with multiple defibrillations and a period of PEA. Had approx 25 minutes CPR with spontaneous return of circulation. Evaluated in ER and code STEMI called, pt taken emergently to cath lab. PCCM admitted for hypothermia protocol.   MAJOR EVENTS/TEST RESULTS: 6/30 Admitted to CCU after prolonged cardiac arrest. Hypothermia protocol initiated 6/30 CT head: NAICP 6/30 LHC: Normal coronary arteries except for thrombotic apical occlusion of LAD. Very severe global LV systolic dysfunction with LVEF 10%. Unsuccessful PCI of the apical LAD with attempted aspiration thrombectomy and PTCA. 7/01 EEG: consistent with a profound generalized cerebral dysfunction(encephalopathy) as can be seen with hypoxic encephalopathy. The presence of sedating medication and hypothermia does make this recording less definite for prognostication. There was no seizure or seizure predisposition recorded on this study 7/01 TTE: LVEF 15%. Grade 2 DD 7/2- myoclonus 7/2 eeg-Abnormal EEG due to the presence of a profound generalized cerebral dysfunction (encephalopathy) which can be seen with hypoxic encephalopathy 7/3 CT head>>>mild loss of gray-white differentiation within both cerebral hemispheres, though this is still partially preserved. This raises concern for some degree of anoxic brain injury. Associated decreased attenuation at the caudate bilaterally. Mild mass effect   Subjective:    Out 2 L yesterday. Full ventilatory support.  CVP 12-13 Per family in the room, she has tried to open her eyes and squeezed their hands on  command.  Family wants to know if there is any further information available concerning her prognosis.  Objective:   Weight Range: 183 lb 3.2 oz (83.1 kg) Body mass index is 28.69 kg/(m^2).   Vital Signs:   Temp:  [98.1 F (36.7 C)-100.2 F (37.9 C)] 98.9 F (37.2 C) (07/05 0800) Pulse Rate:  [41-124] 113 (07/05 0800) Resp:  [19-49] 26 (07/05 0800) BP: (94-153)/(53-106) 130/86 mmHg (07/05 0800) SpO2:  [97 %-100 %] 100 % (07/05 0800) FiO2 (%):  [40 %] 40 % (07/05 0800) Weight:  [183 lb 3.2 oz (83.1 kg)] 183 lb 3.2 oz (83.1 kg) (07/05 0438) Last BM Date:  (unknown prior to admission)  Weight change: Filed Weights   03/27/15 0435 03/28/15 0500 03/29/15 0438  Weight: 190 lb 15.1 oz (86.61 kg) 185 lb 3 oz (84 kg) 183 lb 3.2 oz (83.1 kg)    Intake/Output:   Intake/Output Summary (Last 24 hours) at 03/29/15 0947 Last data filed at 03/29/15 0800  Gross per 24 hour  Intake   2839 ml  Output   5105 ml  Net  -2266 ml     Physical Exam: General: Sedated in bed. Neuro: Non verbal, sedated, on vent. HEENT: Feeding tube and vent in place. Neck: Supple without bruits, JVP 12-13.  Lungs: Mechanical breath sounds, on full ventilatory support. Heart: RRR, No s4 or murmurs noted. +S3.  Abdomen: Soft, non-distended, BS + x 4.  Extremities: No clubbing, cyanosis or edema. DP/PT/Radials 2+ and equal bilaterally.   Telemetry: Persistent Sinus tach ~120s. Occasional PVCs. Rare Couplets. Rare Triplets.  Labs: CBC  Recent Labs  03/28/15 0430 03/29/15 0453  WBC 30.1* 22.4*  NEUTROABS 27.1* 18.8*  HGB 8.0* 8.4*  HCT 28.6* 30.3*  MCV  68.6* 70.5*  PLT 272 302   Basic Metabolic Panel  Recent Labs  03/27/15 0517 03/28/15 0430 03/29/15 0453  NA 142 142 147*  K 3.6 3.2* 3.6  CL 111 108 111  CO2 21* 24 26  GLUCOSE 92 140* 130*  BUN 20 22* 25*  CALCIUM 8.5* 8.5* 9.1  MG 2.0 1.8 2.2  PHOS 3.1 2.7  --    Liver Function Tests  Recent Labs  03/28/15 0430 03/29/15 0453   AST 94* 77*  ALT 46 38  ALKPHOS 53 51  BILITOT 1.2 0.9  PROT 6.1* 6.7  ALBUMIN 2.6* 2.6*   No results for input(s): LIPASE, AMYLASE in the last 72 hours. Cardiac Enzymes No results for input(s): CKTOTAL, CKMB, CKMBINDEX, TROPONINI in the last 72 hours.  BNP: BNP (last 3 results) No results for input(s): BNP in the last 8760 hours.  ProBNP (last 3 results) No results for input(s): PROBNP in the last 8760 hours.   D-Dimer No results for input(s): DDIMER in the last 72 hours. Hemoglobin A1C No results for input(s): HGBA1C in the last 72 hours. Fasting Lipid Panel No results for input(s): CHOL, HDL, LDLCALC, TRIG, CHOLHDL, LDLDIRECT in the last 72 hours. Thyroid Function Tests No results for input(s): TSH, T4TOTAL, T3FREE, THYROIDAB in the last 72 hours.  Invalid input(s): FREET3  Other results:     Imaging/Studies:  Ct Head Wo Contrast  03/27/2015   CLINICAL DATA:  Acute onset of anoxia.  Initial encounter.  EXAM: CT HEAD WITHOUT CONTRAST  TECHNIQUE: Contiguous axial images were obtained from the base of the skull through the vertex without intravenous contrast.  COMPARISON:  CT of the head performed 03/01/2015  FINDINGS: There is suggestion of mild loss of gray-white differentiation within the cerebral hemispheres, though this is still partially preserved. The cerebellar hemispheres appear grossly unremarkable. Decreased attenuation at the caudate bilaterally could reflect ischemic injury.  Underlying mild mass effect is noted, without evidence of midline shift. The third and lateral ventricles are grossly unremarkable. The fourth ventricle is within normal limits.  There is no evidence of fracture; visualized osseous structures are unremarkable in appearance. The orbits are within normal limits. The paranasal sinuses and mastoid air cells are well-aerated. No significant soft tissue abnormalities are seen.  IMPRESSION: Suggestion of mild loss of gray-white differentiation within  both cerebral hemispheres, though this is still partially preserved. This raises concern for some degree of anoxic brain injury. Associated decreased attenuation at the caudate bilaterally. Mild mass effect, without evidence of midline shift.  These results were called by telephone at the time of interpretation on 03/27/2015 at 10:30 pm to [facility] RN on [facility], who verbally acknowledged these results.   Electronically Signed   By: Roanna Raider M.D.   On: 03/27/2015 22:31   Dg Chest Port 1 View  03/29/2015   CLINICAL DATA:  Cardiac arrest.  EXAM: PORTABLE CHEST - 1 VIEW  COMPARISON:  03/28/2015.  FINDINGS: Endotracheal tube, left IJ line, NG tube in stable position. Cardiomegaly. Continued slight improvement of pulmonary interstitial edema. Left lower lobe atelectasis and/or consolidation. Small left pleural effusion cannot be excluded. No pneumothorax.  IMPRESSION: 1. Lines and tubes in stable position. 2. Cardiomegaly with continued slight interim improvement of pulmonary interstitial edema. Small left pleural effusion cannot be excluded. 3. Dense left lower lobe atelectasis and/or consolidation.   Electronically Signed   By: Maisie Fus  Register   On: 03/29/2015 07:12   Dg Chest Port 1 View  03/28/2015  CLINICAL DATA:  Pulmonary edema  EXAM: PORTABLE CHEST - 1 VIEW  COMPARISON:  03/27/2015  FINDINGS: Cardiomegaly with vascular congestion. Decreasing interstitial prominence, likely improving interstitial edema. Focal opacity in the left lower lobe could represent atelectasis or infiltrate. Probable small left effusion.  Support devices are stable.  IMPRESSION: Improving interstitial edema pattern.  Continued left lower lobe atelectasis or infiltrate with probable small left effusion.   Electronically Signed   By: Charlett Nose M.D.   On: 03/28/2015 07:30     Latest Echo  03/25/15 LV EF: 15% Study Conclusions  - Left ventricle: The cavity size was moderately dilated. Wall thickness was normal. The  estimated ejection fraction was 15%. Diffuse hypokinesis. Features are consistent with a pseudonormal left ventricular filling pattern, with concomitant abnormal relaxation and increased filling pressure (grade 2 diastolic dysfunction). - Aortic valve: There was trivial regurgitation. - Mitral valve: There was trivial regurgitation. - Left atrium: The atrium was moderately dilated. - Right ventricle: The cavity size was mildly dilated. Systolic function was moderately reduced. - Right atrium: The atrium was moderately dilated. - Tricuspid valve: Peak RV-RA gradient (S): 32 mm Hg. - Pulmonary arteries: PA peak pressure: 40 mm Hg (S). - Systemic veins: IVC measured 1.9 cm with < 50% respirophasic variation, suggesting RA pressure 8 mmHg. - Pericardium, extracardiac: A trivial pericardial effusion was identified.  Impressions:  - Moderately dilated LV with EF 15%, diffuse hypokinesis. Moderate diastolic dysfunction . Mildly dilated RV with moderately decreased systolic function. Moderate biatrial enlargement. Mild pulmonary hypertension.  Latest Cath  [facility] 2015-04-03 Dist LAD lesion, 100% stenosed. There is a 100% residual stenosis post intervention. There is severe left ventricular systolic dysfunction.  FINAL CONCLUSIONS: - NORMAL CORONARY ARTERIES EXCEPT FOR THROMBOTIC APICAL OCCLUSION OF THE LAD - VERY SEVERE GLOBAL LV SYSTOLIC DYSFUNCTION WITH LVEF 10% - UNSUCCESSFUL PCI OF THE APICAL LAD WITH ATTEMPTED ASPIRATION THROMBECTOMY AND PTCA  RECOMMENDATIONS: - SUPPORTIVE CARE - AT COMPLETION OF PROCEDURE, AN OG TUBE IS PLACED AND THERE IS BLOOD RETURN FROM THE STOMACH. WILL HOLD OFF ON A THIENOPYRIDINE SINCE NO STENT IS PLACED AND THE PATIENT IS BLEEDING  Medications:     Scheduled Medications: . ampicillin-sulbactam (UNASYN) IV  3 g Intravenous Q6H  . antiseptic oral rinse  7 mL Mouth Rinse QID  . aspirin  81 mg Per Tube Daily  . chlorhexidine  15 mL  Mouth Rinse BID  . feeding supplement (PRO-STAT 64)  30 mL Per Tube q morning - 10a  . insulin aspart  0-9 Units Subcutaneous 6 times per day  . LORazepam  2 mg Intravenous 4 times per day  . pantoprazole sodium  40 mg Per Tube Daily  . potassium chloride  40 mEq Per Tube Once  . sodium chloride  3 mL Intravenous Q12H  . sodium chloride  3 mL Intravenous Q12H  . ticagrelor  90 mg Per Tube BID  . valproate sodium  500 mg Intravenous Q12H     Infusions: . feeding supplement (VITAL AF 1.2 CAL) 1,000 mL (03/29/15 0800)  . heparin 1,700 Units/hr (03/29/15 0800)  . midazolam (VERSED) infusion Stopped (03/28/15 1230)  . norepinephrine (LEVOPHED) Adult infusion Stopped (03/27/15 0207)     PRN Medications:  sodium chloride, sodium chloride, acetaminophen, midazolam, ondansetron (ZOFRAN) IV, sodium chloride, sodium chloride   Assessment/Plan   1. VF Arrest / Cardiogenic Shock - 6/30 CPR approx 25 minutes with multiple defib attempts prior to return of circulation. - Code STEMI called with unsuccessful PCI  of apical LAD with attempted aspiration thrombectomy and PTCA. - Currently still on full dose heparin  - Given absence of atherosclerotic coronary disease in other distributions on cath, concern for cardioembolism leading to occlusion of apical LAD. Given this concern will stop Brilinta and plan on transition to coumadin.  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  Diffuse hypokinesis - No comparison echo. - LHC 03/12/2015: Normal coronaries except thrombotic apical occlusion LAD. Very severe global LV systolic dysfunction with EF 10%.  - Out 2 L yesterday - Cont 40 mg IV lasix q 12 hrs. - Will start on Lisinopril  2.5 mg bid, 12.5 mg spironolactone daily - CVPs Q shift - 12-13 today. - Strict I/Os and daily weights. - Cr 1.07 today.  Will follow with diuresis 3. Acute respiratory failure s/p cardiac arrest. - Currently requiring full vent support. - Likely trach 03/31/15 - Per CCM 4. Acute anoxic encephalopathy  - Abnormal EEG 7/1 and 7/2 with profound cerebral dysfunction. - CT head 7/3 concern for Anoxic brain injury with mild loss of gray-white differentiation bilaterally. - Keppra started for myoclonus 5. H Flu Pneumonia  - Noted 03/26/15 - WBC 22.4 - Unasyn started 03/26/15 through May 02, 2015 6. Hypokalemia - 3.6 today.  - Continue 40 mEq K solution but will observe closely with starting Spironolactone 7. Anemia - Hemoglobin stable ~ 8 for past three days.   - No bleeding noted. 8. Care Planning - Palliative care to meet with family in next day    Length of Stay: 5   Graciella Freer PA-C 03/29/2015, 9:47 AM  Advanced Heart Failure Team Pager [phone] (M-F; 7a - 4p)  Please contact [facility] for night-coverage after hours (4p -7a ) and weekends on [email or web address]  Patient seen with PA, agree with the above note.  1. Acute MI: Suspect cardioembolism with thrombus in distal LAD.  No atherosclerotic coronary disease in other distributions.  Unsuccessful PCI.  Possibly from small, unrecognized LV thrombus.   - Think we can stop Brilinta. - Continue ASA 81, statin.  - Would continue anticoagulation due to concern for LV thrombus/cardioembolism.  Begin transition from heparin to coumadin with overlap whenever CCM ok with [place] starting coumadin => note plan for tracheostomy on 7/7.  2. Acute on chronic systolic CHF: Patient had pre-existing CMP followed in [place] but I do not have records on this.  Apparently no prior CAD.  ?Substance abuse-related given amphetamines on admission UDS. Currently volume overloaded with CVP 12-13 and S3 on exam.  Mild sinus tachycardia.  - Would check co-ox and  follow CVP.  - Diuresing well with IV Lasix, continue 40 mg IV bid.  - Add lisinopril 2.5 mg bid and spironolactone 12.5 daily.  No beta blocker yet with possible low output.  3. H influenzae PNA: Per CCM.  4. Anoxic brain injury: Minimal awakening so far.  Followed by neurology.  It appears that chance for functional recovery is pretty low at this point but family continues to want all possible treatment.   45 minutes critical care time  Marca Ancona 03/29/2015 1:15 PM

## 2015-03-29 NOTE — Progress Notes (Signed)
ANTICOAGULATION CONSULT NOTE - Follow Up Consult  Pharmacy Consult for heparin Indication: chest pain/ACS  Labs:  Recent Labs  03/27/15 0517  03/27/15 1245 03/28/15 0430 03/29/15 0452 03/29/15 0453  HGB 8.3*  --   --  8.0*  --  8.4*  HCT 29.6*  --   --  28.6*  --  30.3*  PLT 300  --   --  272  --  302  HEPARINUNFRC  --   < > 0.55 0.53 0.51  --   CREATININE 0.83  --   --  0.89  --  1.07*  < > = values in this interval not displayed.  Estimated Creatinine Clearance: 72.8 mL/min (by C-G formula based on Cr of 1.07).  Assessment: 49 yo F presents on 6/30 post Vfib arrest. Approximately 25 minutes of CPR. Evaluated in ER and code STEMI called. Taken to cath lab. Hypothermia protocol initiated.  PMH: Cardiomyopathy  Pharmacy consulted to [facility] heparin for extensive LAD thrombus.  Anticoagulation: STEMI s/p cath w/ extensive thrombectomy (bival and tirofiban in cath, no stent). Per Dr. Excell Seltzer plans for heparin due to extent of LAD occlusion; thought to be embolic  CBC stable, no bleeding noted On: Heparin drip @ 1700/hr  No events overnight, heparin continues to be at goal without complications.  Goal of Therapy:  Heparin level 0.3-0.7 units/ml Monitor platelets by anticoagulation protocol: Yes   Plan:  1. Continue heparin to 1700 units/hr  2. Will f/u AM heparin level  Daily HL, CBC  Thank you for allowing pharmacy to be a part of this patients care team.  Sheppard Coil PharmD., BCPS Clinical Pharmacist Pager [phone] 03/29/2015 9:02 AM

## 2015-03-29 NOTE — Progress Notes (Signed)
Initial Nutrition Assessment   INTERVENTION:  Recommend bowel regimen, no bm since admission (5 days) - discussed with RN.   Vital AF 1.2 @ 60 mL/hr with 30 mL Prostat once/day Provides 1828 kcal (93% of needs), 123 grams protein, and 1168 mL free water.  NUTRITION DIAGNOSIS:  Inadequate oral intake related to inability to eat as evidenced by NPO status.  ongoing  GOAL:  Patient will meet greater than or equal to 90% of their needs  met  MONITOR:  Vent status, Labs, Weight trends  REASON FOR ASSESSMENT:  Consult Enteral/tube feeding initiation and management  ASSESSMENT:  Pt with hx of CHF possible non-compliance (limited resources) with medications admitted after vfib arrest; 25 minute CPR.  Pt om hypothermia protocol, cooled 1730 6/30.  Patient is currently intubated on ventilator support MV: 13.1 L/min Temp (24hrs), Avg:99.1 F (37.3 C), Min:98.1 F (36.7 C), Max:100.2 F (37.9 C)  Labs and medications reviewed.  Sodium and WBC's elevated OG tube with tip in stomach body Pt discussed during ICU rounds and with RN.  Per RN pt to have trach/PEG placed Thursday   Height:  Ht Readings from Last 1 Encounters:  03/17/2015 5' 7"  (1.702 m)    Weight:  Wt Readings from Last 1 Encounters:  03/29/15 183 lb 3.2 oz (83.1 kg)    Ideal Body Weight:  61.3 kg  Wt Readings from Last 10 Encounters:  03/29/15 183 lb 3.2 oz (83.1 kg)    BMI:  Body mass index is 28.69 kg/(m^2).  Estimated Nutritional Needs:  Kcal:  1969  Protein:  101-126 grams  Fluid:  >/= 1.5 L/day  Skin:  Reviewed, no issues  Diet Order:    NPO  EDUCATION NEEDS:  No education needs identified at this time   Intake/Output Summary (Last 24 hours) at 03/29/15 1146 Last data filed at 03/29/15 0800  Gross per 24 hour  Intake   2443 ml  Output   5105 ml  Net  -2662 ml    Last BM:  PTA  Maylon Peppers RD, [place], [place] Pager [phone] After Hours Pager

## 2015-03-30 ENCOUNTER — Inpatient Hospital Stay (HOSPITAL_COMMUNITY): Payer: BLUE CROSS/BLUE SHIELD

## 2015-03-30 DIAGNOSIS — R40243 Glasgow coma scale score 3-8: Secondary | ICD-10-CM

## 2015-03-30 DIAGNOSIS — G931 Anoxic brain damage, not elsewhere classified: Secondary | ICD-10-CM | POA: Insufficient documentation

## 2015-03-30 DIAGNOSIS — I4901 Ventricular fibrillation: Principal | ICD-10-CM

## 2015-03-30 DIAGNOSIS — R4020 Unspecified coma: Secondary | ICD-10-CM

## 2015-03-30 LAB — BASIC METABOLIC PANEL
Anion gap: 13 (ref 5–15)
Anion gap: 7 (ref 5–15)
BUN: 32 mg/dL — AB (ref 6–20)
BUN: 28 mg/dL — ABNORMAL HIGH (ref 6–20)
CHLORIDE: 113 mmol/L — AB (ref 101–111)
CO2: 29 mmol/L (ref 22–32)
CO2: 27 mmol/L (ref 22–32)
CREATININE: 1.44 mg/dL — AB (ref 0.44–1.00)
Calcium: 9.5 mg/dL (ref 8.9–10.3)
Calcium: 8.2 mg/dL — ABNORMAL LOW (ref 8.9–10.3)
Chloride: 119 mmol/L — ABNORMAL HIGH (ref 101–111)
Creatinine, Ser: 1.19 mg/dL — ABNORMAL HIGH (ref 0.44–1.00)
GFR calc Af Amer: 50 mL/min — ABNORMAL LOW (ref >60)
GFR, EST NON AFRICAN AMERICAN: 43 mL/min — AB (ref >60)
GFR, EST NON AFRICAN AMERICAN: 54 mL/min — AB (ref >60)
GLUCOSE: 140 mg/dL — AB (ref 65–99)
Glucose, Bld: 140 mg/dL — ABNORMAL HIGH (ref 65–99)
Potassium: 3.4 mmol/L — ABNORMAL LOW (ref 3.5–5.1)
Potassium: 3.5 mmol/L (ref 3.5–5.1)
SODIUM: 155 mmol/L — AB (ref 135–145)
SODIUM: 153 mmol/L — AB (ref 135–145)

## 2015-03-30 LAB — GLUCOSE, CAPILLARY
GLUCOSE-CAPILLARY: 125 mg/dL — AB (ref 65–99)
GLUCOSE-CAPILLARY: 112 mg/dL — AB (ref 65–99)
GLUCOSE-CAPILLARY: 156 mg/dL — AB (ref 65–99)
Glucose-Capillary: 133 mg/dL — ABNORMAL HIGH (ref 65–99)
Glucose-Capillary: 133 mg/dL — ABNORMAL HIGH (ref 65–99)
Glucose-Capillary: 126 mg/dL — ABNORMAL HIGH (ref 65–99)
Glucose-Capillary: 128 mg/dL — ABNORMAL HIGH (ref 65–99)

## 2015-03-30 LAB — HEPARIN LEVEL (UNFRACTIONATED)
HEPARIN UNFRACTIONATED: 0.75 [IU]/mL — AB (ref 0.30–0.70)
Heparin Unfractionated: 0.67 IU/mL (ref 0.30–0.70)
Heparin Unfractionated: 0.95 IU/mL — ABNORMAL HIGH (ref 0.30–0.70)
Heparin Unfractionated: 0.75 IU/mL — ABNORMAL HIGH (ref 0.30–0.70)

## 2015-03-30 LAB — CBC
HCT: 34.3 % — ABNORMAL LOW (ref 36.0–46.0)
Hemoglobin: 9.3 g/dL — ABNORMAL LOW (ref 12.0–15.0)
MCH: 19.3 pg — AB (ref 26.0–34.0)
MCHC: 27.1 g/dL — ABNORMAL LOW (ref 30.0–36.0)
MCV: 71 fL — AB (ref 78.0–100.0)
PLATELETS: 365 10*3/uL (ref 150–400)
RBC: 4.83 MIL/uL (ref 3.87–5.11)
RDW: 21.1 % — AB (ref 11.5–15.5)
WBC: 18.6 10*3/uL — ABNORMAL HIGH (ref 4.0–10.5)

## 2015-03-30 LAB — CARBOXYHEMOGLOBIN
Carboxyhemoglobin: 1.8 % — ABNORMAL HIGH (ref 0.5–1.5)
Methemoglobin: 1.3 % (ref 0.0–1.5)
O2 Saturation: 71.8 %
Total hemoglobin: 8.5 g/dL — ABNORMAL LOW (ref 12.0–16.0)

## 2015-03-30 LAB — LIPID PANEL
CHOL/HDL RATIO: 3.9 ratio
CHOLESTEROL: 131 mg/dL (ref 0–200)
HDL: 34 mg/dL — ABNORMAL LOW (ref >40)
LDL Cholesterol: 81 mg/dL (ref 0–99)
Triglycerides: 79 mg/dL (ref <150)
VLDL: 16 mg/dL (ref 0–40)

## 2015-03-30 LAB — MAGNESIUM: Magnesium: 2.1 mg/dL (ref 1.7–2.4)

## 2015-03-30 LAB — PHOSPHORUS: PHOSPHORUS: 4.7 mg/dL — AB (ref 2.5–4.6)

## 2015-03-30 LAB — LACTIC ACID, PLASMA: Lactic Acid, Venous: 1.3 mmol/L (ref 0.5–2.0)

## 2015-03-30 LAB — PROCALCITONIN: PROCALCITONIN: 1.02 ng/mL

## 2015-03-30 MED ORDER — VANCOMYCIN HCL IN DEXTROSE 750-5 MG/150ML-% IV SOLN
750.0000 mg | Freq: Two times a day (BID) | INTRAVENOUS | Status: DC
  Administered 2015-03-30 – 2015-03-31 (×3): 750 mg via INTRAVENOUS
  Filled 2015-03-30 (×4): qty 150

## 2015-03-30 MED ORDER — SODIUM CHLORIDE 0.9 % IV BOLUS (SEPSIS)
1000.0000 mL | Freq: Once | INTRAVENOUS | Status: AC
Start: 2015-03-30 — End: 2015-03-30
  Administered 2015-03-30: 1000 mL via INTRAVENOUS

## 2015-03-30 MED ORDER — PIPERACILLIN-TAZOBACTAM 3.375 G IVPB
3.3750 g | Freq: Three times a day (TID) | INTRAVENOUS | Status: DC
  Administered 2015-03-30 – 2015-03-31 (×3): 3.375 g via INTRAVENOUS
  Filled 2015-03-30 (×6): qty 50

## 2015-03-30 MED ORDER — HEPARIN (PORCINE) IN NACL 100-0.45 UNIT/ML-% IJ SOLN
1350.0000 [IU]/h | INTRAMUSCULAR | Status: DC
  Administered 2015-03-31: 1350 [IU]/h via INTRAVENOUS
  Filled 2015-03-30 (×3): qty 250

## 2015-03-30 MED ORDER — NOREPINEPHRINE BITARTRATE 1 MG/ML IV SOLN
2.0000 ug/min | INTRAVENOUS | Status: DC
  Administered 2015-03-30: 3 ug/min via INTRAVENOUS
  Administered 2015-03-31: 4 ug/min via INTRAVENOUS
  Filled 2015-03-30 (×2): qty 4

## 2015-03-30 MED ORDER — FREE WATER
300.0000 mL | Freq: Four times a day (QID) | Status: DC
  Administered 2015-03-30 – 2015-03-31 (×5): 300 mL

## 2015-03-30 NOTE — Progress Notes (Signed)
eLink Physician-Brief Progress Note Patient Name: Anna Kemp DOB: Aug 12, 1966 MRN: [id number]   Date of Service  03/30/2015  HPI/Events of Note  BP = 61/37.  eICU Interventions  Will bolus with 0.9 NaCl 1 liter IV over 1 hour now.      Intervention Category Major Interventions: Hypotension - evaluation and management  [name] 03/30/2015, 6:00 AM

## 2015-03-30 NOTE — Progress Notes (Signed)
Subjective: Patient currently intubated and non responsive to pain.  Neurology asked to reevaluate for prognostication.   Anna Kemp is an 49 y.o. female hx of CHF/cardiomyopathy presented on 6/30 after V fib arrest. Approximately of CPR before ROSC. Underwent hypothermia protocol. Overnight when sedation turned off, patient noted to have muscle spasms in her extremities consistent with myoclonic activity. Became more pronounced and frequent this morning. Given a total of  ativan and  of versed with resolution of movements.   EEG on 7/2  --Abnormal EEG due to the presence of a profound generalized cerebral dysfunction (encephalopathy) which can be seen with hypoxic encephalopathy.   CT head 7/3 --Suggestion of mild loss of gray-white differentiation within both cerebral hemispheres, though this is still partially preserved. Thisraises concern for some degree of anoxic brain injury. Associated decreased attenuation at the caudate bilaterally. Mild mass effect, without evidence of midline shift.   Objective: Current vital signs: BP 118/78 mmHg  Pulse 115  Temp(Src) 102 F (38.9 C) (Axillary)  Resp 16  Ht  (1.702 m)  Wt 80.6 kg (177 lb 11.1 oz)  BMI 27.82 kg/m2  SpO2 98%  LMP 03/14/2015 (LMP Unknown) Vital signs in last 24 hours: Temp:  [100.4 F (38 C)-102.9 F (39.4 C)] 102 F (38.9 C) (07/06 0700) Pulse Rate:  [108-149] 115 (07/06 0905) Resp:  [16-28] 16 (07/06 0905) BP: (61-139)/(32-113) 118/78 mmHg (07/06 0905) SpO2:  [97 %-100 %] 98 % (07/06 0905) FiO2 (%):  [40 %] 40 % (07/06 0905) Weight:  [80.6 kg (177 lb 11.1 oz)] 80.6 kg (177 lb 11.1 oz) (07/06 0432)  Intake/Output from previous day: 07/05 0701 - 07/06 0700 In: 4540.9 [I.V.:623.3; NG/GT:1570; IV Piggyback:1505] Out: 5057 [Urine:5057] Intake/Output this shift:   Nutritional status:    Neurologic Exam: Mental Status: Patient does not respond to verbal stimuli.  Does not respond to deep sternal  rub.  Does not follow commands.  No verbalizations noted. Patient is currently on no sedating medications. Cranial Nerves: II: patient does not respond confrontation bilaterally, pupils right 2 mm, left 2 mm,and reactive bilaterally III,IV,VI: doll's response absent with oculocephalic maneuvers. V,VII: corneal reflex present bilaterally, but reactivity was minimal.  VIII: patient does not respond to verbal stimuli IX,X: gag reflex present, XI: trapezius strength unable to test bilaterally XII: tongue strength unable to test Motor: Extremities flaccid throughout.  No spontaneous movement noted.  No purposeful movements noted. No abnormal posturing. Sensory: Does not respond to noxious stimuli in any extremity. Deep Tendon Reflexes:  Absent throughout. Plantars: Mute bilaterally Cerebellar: Unable to perform  EEG today showed no detectable electrocerebral activity, including with noxious stimulation applied. This study was not a dedicated range death determination. However, findings are consistent patient's clinical findings of anoxic brain injury.  CT scan of the brain obtained today with findings consistent with diffuse anoxic brain injury with loss of gray-white matter differentiation, as seen on 03/27/2015.    Lab Results: Basic Metabolic Panel:  Recent Labs Lab 03/25/15 0400  03/26/15 0446 03/27/15 0517 03/28/15 0430 03/29/15 0453 03/30/15 0450  NA 141  < > 139 142 142 147* 155*  K 4.1  < > 3.4* 3.6 3.2* 3.6 3.4*  CL 111  < > 109 111 108 111 113*  CO2 22  < > 19* 21* GLUCOSE 137*  < > 116* 92 140* 130* 140*  BUN 20  < > 19 20 22* 25* 32*  CREATININE 1.05*  < > 1.01* 0.83  0.89 1.07* 1.44*  CALCIUM 8.3*  < > 8.3* 8.5* 8.5* 9.1 9.5  MG 2.1  --  1.9 2.0 1.8 2.2 2.1  PHOS 4.2  --  4.3 3.1 2.7  --  4.7*  < > = values in this interval not displayed.  Liver Function Tests:  Recent Labs Lab 03/01/2015 1037 03/27/15 0517 03/28/15 0430 03/29/15 0453  AST 84*  129* 94* 77*  ALT 55* 57* 46 38  ALKPHOS 52 51 53 51  BILITOT 1.0 1.2 1.2 0.9  PROT 6.1* 5.9* 6.1* 6.7  ALBUMIN 3.0* 2.7* 2.6* 2.6*   No results for input(s): LIPASE, AMYLASE in the last 168 hours. No results for input(s): AMMONIA in the last 168 hours.  CBC:  Recent Labs Lab 03/05/2015 1037  03/26/15 0446 03/27/15 0517 03/28/15 0430 03/29/15 0453 03/30/15 0450  WBC 30.3*  < > 24.3* 23.6* 30.1* 22.4* 18.6*  NEUTROABS 26.7*  --   --  20.8* 27.1* 18.8*  --   HGB 8.9*  < > 9.3* 8.3* 8.0* 8.4* 9.3*  HCT 32.7*  < > 32.9* 29.6* 28.6* 30.3* 34.3*  MCV 70.8*  < > 68.7* 68.8* 68.6* 70.5* 71.0*  PLT 268  < > 315 300 272 302 365  < > = values in this interval not displayed.  Cardiac Enzymes:  Recent Labs Lab 03/02/2015 1435 03/04/2015 2225 03/25/15 0400  TROPONINI 18.61* 40.99* 35.58*    Lipid Panel:  Recent Labs Lab 03/30/15 0450  CHOL 131  TRIG 79  HDL 34*  CHOLHDL 3.9  VLDL 16  LDLCALC 81    CBG:  Recent Labs Lab 03/29/15 1704 03/29/15 1952 03/29/15 2313 03/30/15 0407 03/30/15 0723  GLUCAP 130* 133* 133* 125* 112*    Microbiology: Results for orders placed or performed during the hospital encounter of 03/04/2015  Culture, Urine     Status: None   Collection Time: 03/22/2015 10:32 AM  Result Value Ref Range Status   Specimen Description URINE, CATHETERIZED  Final   Special Requests NONE  Final   Culture NO GROWTH 1 DAY  Final   Report Status 03/25/2015 FINAL  Final  Culture, blood (routine x 2)     Status: None   Collection Time: 03/21/2015 10:37 AM  Result Value Ref Range Status   Specimen Description BLOOD RIGHT ANTECUBITAL  Final   Special Requests BOTTLES DRAWN AEROBIC ONLY 5CC  Final   Culture NO GROWTH 5 DAYS  Final   Report Status 03/29/2015 FINAL  Final  MRSA PCR Screening     Status: None   Collection Time: 02/25/2015  2:37 PM  Result Value Ref Range Status   MRSA by PCR NEGATIVE NEGATIVE Final    Comment:        The GeneXpert MRSA Assay  (FDA approved for NASAL specimens only), is one component of a comprehensive MRSA colonization surveillance program. It is not intended to diagnose MRSA infection nor to guide or monitor treatment for MRSA infections.   Culture, blood (routine x 2)     Status: None   Collection Time: 03/02/2015  4:45 PM  Result Value Ref Range Status   Specimen Description BLOOD LEFT HAND  Final   Special Requests BOTTLES DRAWN AEROBIC ONLY 4CC  Final   Culture NO GROWTH 5 DAYS  Final   Report Status 03/29/2015 FINAL  Final  Culture, respiratory (NON-Expectorated)     Status: None   Collection Time: 03/26/15 11:50 AM  Result Value Ref Range Status   Specimen Description TRACHEAL  ASPIRATE  Final   Special Requests Normal  Final   Gram Stain   Final    FEW WBC PRESENT,BOTH PMN AND MONONUCLEAR NO SQUAMOUS EPITHELIAL CELLS SEEN MODERATE GRAM NEGATIVE RODS Performed at [facility]    Culture   Final    ABUNDANT HAEMOPHILUS INFLUENZAE Note: BETA LACTAMASE NEGATIVE Performed at [facility]    Report Status 03/28/2015 FINAL  Final    Coagulation Studies: No results for input(s): LABPROT, INR in the last 72 hours.  Imaging: Dg Chest Port 1 View  03/29/2015   CLINICAL DATA:  Cardiac arrest.  EXAM: PORTABLE CHEST - 1 VIEW  COMPARISON:  03/28/2015.  FINDINGS: Endotracheal tube, left IJ line, NG tube in stable position. Cardiomegaly. Continued slight improvement of pulmonary interstitial edema. Left lower lobe atelectasis and/or consolidation. Small left pleural effusion cannot be excluded. No pneumothorax.  IMPRESSION: 1. Lines and tubes in stable position. 2. Cardiomegaly with continued slight interim improvement of pulmonary interstitial edema. Small left pleural effusion cannot be excluded. 3. Dense left lower lobe atelectasis and/or consolidation.   Electronically Signed   By: Maisie Fus  Register   On: 03/29/2015 07:12    Medications:  Scheduled: . antiseptic oral rinse  7 mL Mouth  Rinse QID  . aspirin  81 mg Per Tube Daily  . atorvastatin  40 mg Per Tube q1800  . chlorhexidine  15 mL Mouth Rinse BID  . feeding supplement (PRO-STAT 64)  30 mL Per Tube q morning - 10a  . free water  300 mL Per Tube Q6H  . insulin aspart  0-9 Units Subcutaneous 6 times per day  . LORazepam  2 mg Intravenous 4 times per day  . pantoprazole sodium  40 mg Per Tube Daily  . potassium chloride  40 mEq Per Tube Daily  . sodium chloride  3 mL Intravenous Q12H  . sodium chloride  3 mL Intravenous Q12H  . valproate sodium  500 mg Intravenous Q12H    Assessment/Plan: 43-year-old lady, status post arrest with ventricular fibrillation, with clinical findings consistent with severe diffuse anoxic brain injury. She has shown no signs of recovery of consciousness. Given her clinical findings, as well as EEG and CT scan findings, her prognosis at this point is very poor for recovery of independent functioning, if she were to regain consciousness.  Recommend aggressive intervention to the extent of family's wishes. We will continue to follow on an as-needed basis.  Felicie Morn PA-C [facility] [phone]  03/30/2015, 10:21 AM   I personally participated in this patient's evaluation and management, including neurological examination, as well as formulating the above clinical impression and management recommendations.  Venetia Maxon M.D. [facility] [phone]

## 2015-03-30 NOTE — Progress Notes (Signed)
ANTICOAGULATION CONSULT NOTE - Follow Up Consult  Pharmacy Consult for heparin Indication: LV thrombus  Not on File  Patient Measurements: Height: 5\' 7"  (170.2 cm) Weight: 177 lb 11.1 oz (80.6 kg) IBW/kg (Calculated) : 61.6 Heparin Dosing Weight: ~78 kg  Vital Signs: Temp: 97.9 F (36.6 C) (07/06 2002) Temp Source: Oral (07/06 2002) BP: 116/74 mmHg (07/06 2000) Pulse Rate: 104 (07/06 2000)  Labs:  Recent Labs  03/28/15 0430  03/29/15 0453 03/30/15 0450 03/30/15 1221 03/30/15 1333 03/30/15 1530 03/30/15 2045  HGB 8.0*  --  8.4* 9.3*  --   --   --   --   HCT 28.6*  --  30.3* 34.3*  --   --   --   --   PLT 272  --  302 365  --   --   --   --   HEPARINUNFRC 0.53  < >  --  0.75* 0.67 0.95*  --  0.75*  CREATININE 0.89  --  1.07* 1.44*  --   --  1.19*  --   < > = values in this interval not displayed.  Estimated Creatinine Clearance: 64.5 mL/min (by C-G formula based on Cr of 1.19).   Assessment: 49 yo female admitted 6/30 with vfib arrest, s/p CPR.  S/p cath lab, hypothermia protocol, suspected LV thrombus on IV heparin.  Heparin level remains elevated on 1450 units/hr.  Will reduce rate accordingly.  No bleeding noted.  Goal of Therapy:  Heparin level 0.3-0.7 units/ml Monitor platelets by anticoagulation protocol: Yes   Plan:  1. Decrease IV heparin to 1350 units/hr. 2. Recheck heparin level with AM labs. 3. Continue daily heparin level and CBC. 4. F/u eventual conversion to enteral anticoagulation.  [employer], Pharm.D., BCPS Clinical Pharmacist Pager [phone] 03/30/2015 10:11 PM

## 2015-03-30 NOTE — Progress Notes (Signed)
To Whom It May Concner  Anna Kemp 17-May-1966 is a patient in our ICU and is critically ill with grim prognosis and could die next few days. Please accommodate request for her incarcerated husband to visit her. Please do not hesistate to contact us if any questions  Sincerely yours  Dr. Kalman Shan, M.D., [facility].C.P Pulmonary and Critical Care Medicine Staff Physician [facility]  Pulmonary and Critical Care Tele: [phone]  03/30/2015 10:45 AM

## 2015-03-30 NOTE — Progress Notes (Signed)
ANTICOAGULATION CONSULT NOTE - Follow Up Consult  Pharmacy Consult for heparin Indication: STEMI   Labs:  Recent Labs  03/28/15 0430 03/29/15 0452 03/29/15 0453 03/30/15 0450  HGB 8.0*  --  8.4* 9.3*  HCT 28.6*  --  30.3* 34.3*  PLT 272  --  302 365  HEPARINUNFRC 0.53 0.51  --  0.75*  CREATININE 0.89  --  1.07*  --     Assessment: 49yo female now supratherapeutic on heparin after several levels at goal.  Goal of Therapy:  Heparin level 0.3-0.7 units/ml   Plan:  Will decrease heparin gtt slightly to 1600 units/hr and check level in 6hr.  Vernard Gambles, PharmD, BCPS  03/30/2015,6:24 AM

## 2015-03-30 NOTE — Progress Notes (Signed)
PULMONARY / CRITICAL CARE MEDICINE   Name: Anna Kemp MRN: [id number] DOB: Jul 06, 1966    ADMISSION DATE:  03/20/2015  REFERRING MD :  EDP  CHIEF COMPLAINT:  Post arrest   INITIAL PRESENTATION:  49yo female with hx cardiomyopathy presented 6/30 after VFib arrest.  Approx 25 minutes CPR.  Evaluated in ER and code STEMI called, pt taken emergently to cath lab.  PCCM called to admit for hypothermia protocol.   INDWELLING DEVICES:: ETT 6/30 >>  L [place] CVL 6/30 >>   MICRO DATA: Urine 6/30 >>  Blood 6/30 >>   ANTIMICROBIALS:     MAJOR EVENTS/TEST RESULTS: 6/30 Admitted to CCU after prolonged cardiac arrest. Hypothermia protocol initiated 6/30 CT head: NAICP 6/30 LHC: Normal coronary arteries except for thrombotic apical occlusion of LAD. Very severe global LV systolic dysfunction with LVEF 10%. Unsuccessful PCI of the apical LAD with attempted aspiration thrombectomy and PTCA. 7/01 EEG: consistent with a profound generalized cerebral dysfunction(encephalopathy) as can be seen with hypoxic encephalopathy. The presence of sedating medication and hypothermia does make this recording less definite for prognostication. There was no seizure or seizure predisposition recorded on this study 7/01 TTE: LVEF 15%. Grade 2 DD 7/2- myoclonus 7/2 eeg-Abnormal EEG due to the presence of a profound generalized cerebral dysfunction (encephalopathy) which can be seen with hypoxic encephalopathy 7/3 CT head>>>mild loss of gray-white differentiation within both cerebral hemispheres, though this is still partially preserved. This raises concern for some degree of anoxic brain injury. Associated decreased attenuation at the caudate bilaterally. Mild mass effect 03/28/15: Neg 1 liter, CT head anoxia  03/29/15: Comatose. Pupil+, Gag  +. Dr Tyson Alias to trach 03/31/15 Thursday.Family at bedside - crying . Cards recommending CHF Service.   SUBJECTIVE/OVERNIGHT/INTERVAL HX 03/30/15: FEbrile and hypotensive. But  corneal and gag +. Pupil _. Daugther at bedside - crying inconsolobly  VITAL SIGNS: Temp:  [100.4 F (38 C)-102.9 F (39.4 C)] 102 F (38.9 C) (07/06 0700) Pulse Rate:  [108-149] 115 (07/06 0905) Resp:  [16-28] 16 (07/06 0905) BP: (61-139)/(32-113) 118/78 mmHg (07/06 0905) SpO2:  [97 %-100 %] 98 % (07/06 0905) FiO2 (%):  [40 %] 40 % (07/06 0905) Weight:  [80.6 kg (177 lb 11.1 oz)] 80.6 kg (177 lb 11.1 oz) (07/06 0432) HEMODYNAMICS: CVP:  [9 mmHg-13 mmHg] 9 mmHg VENTILATOR SETTINGS: Vent Mode:  [-] PRVC FiO2 (%):  [40 %] 40 % Set Rate:  [16 bmp] 16 bmp Vt Set:  [500 mL] 500 mL PEEP:  [5 cmH20] 5 cmH20 Plateau Pressure:  [18 cmH20-20 cmH20] 20 cmH20 INTAKE / OUTPUT:  Intake/Output Summary (Last 24 hours) at 03/30/15 1007 Last data filed at 03/30/15 0700  Gross per 24 hour  Intake 3437.3 ml  Output   4877 ml  Net -1439.7 ml    PHYSICAL EXAMINATION: General: Looks critically ill Neuro:COmatose . Gag +,. Pupil + , Corneal +  not follow commmands HEENT: jvd same Cardiovascular: s1 s2 TRR Lungs: rochi insp mild, less coarse Abdomen:  Soft, BS wnl, no r/g Ext: cool, no edema    LABS: PULMONARY  Recent Labs Lab 02/25/2015 1128  03/18/2015 1545 03/19/2015 1638 03/18/2015 1842 03/26/15 0956 03/27/15 0530 03/29/15 1250 03/29/15 1344  PHART 7.243*  --  7.336*  --   --  7.327* 7.435  --   --   PCO2ART 45.2*  --  38.4  --   --  32.8* 27.5*  --   --   PO2ART 135.0*  --  295.0*  --   --  84.0 110*  --   --   HCO3 19.6*  --  20.9  --   --  17.2* 18.3*  --   --   TCO2 21  < > 19.1  --   --   O2SAT 99.0  --  100.0  --   --  96.0 98.8 87.1 73.9  < > = values in this interval not displayed.  CBC  Recent Labs Lab 03/28/15 0430 03/29/15 0453 03/30/15 0450  HGB 8.0* 8.4* 9.3*  HCT 28.6* 30.3* 34.3*  WBC 30.1* 22.4* 18.6*  PLT 272 302 365    COAGULATION  Recent Labs Lab 02/26/2015 1037 03/15/2015 1839 03/05/2015 2225  INR 1.41 1.60* 1.95*    CARDIAC     Recent Labs Lab 03/05/2015 1435 02/26/2015 2225 03/25/15 0400  TROPONINI 18.61* 40.99* 35.58*   No results for input(s): PROBNP in the last 168 hours.   CHEMISTRY  Recent Labs Lab 03/25/15 0400  03/26/15 0446 03/27/15 0517 03/28/15 0430 03/29/15 0453 03/30/15 0450  NA 141  < > 139 142 142 147* 155*  K 4.1  < > 3.4* 3.6 3.2* 3.6 3.4*  CL 111  < > 109 111 108 111 113*  CO2 22  < > 19* 21* GLUCOSE 137*  < > 116* 92 140* 130* 140*  BUN 20  < > 19 20 22* 25* 32*  CREATININE 1.05*  < > 1.01* 0.83 0.89 1.07* 1.44*  CALCIUM 8.3*  < > 8.3* 8.5* 8.5* 9.1 9.5  MG 2.1  --  1.9 2.0 1.8 2.2 2.1  PHOS 4.2  --  4.3 3.1 2.7  --  4.7*  < > = values in this interval not displayed. Estimated Creatinine Clearance: 53.3 mL/min (by C-G formula based on Cr of 1.44).   LIVER  Recent Labs Lab 03/21/2015 1037 02/23/2015 1839 03/01/2015 2225 03/27/15 0517 03/28/15 0430 03/29/15 0453  AST 84*  --   --  129* 94* 77*  ALT 55*  --   --  57* 46 38  ALKPHOS 52  --   --  51 53 51  BILITOT 1.0  --   --  1.2 1.2 0.9  PROT 6.1*  --   --  5.9* 6.1* 6.7  ALBUMIN 3.0*  --   --  2.7* 2.6* 2.6*  INR 1.41 1.60* 1.95*  --   --   --      INFECTIOUS  Recent Labs Lab 03/07/2015 1047  LATICACIDVEN 6.15*     ENDOCRINE CBG (last 3)   Recent Labs  03/29/15 2313 03/30/15 0407 03/30/15 0723  GLUCAP 133* 125* 112*         IMAGING x48h  - image(s) personally visualized  -   highlighted in bold Dg Chest Port 1 View  03/29/2015   CLINICAL DATA:  Cardiac arrest.  EXAM: PORTABLE CHEST - 1 VIEW  COMPARISON:  03/28/2015.  FINDINGS: Endotracheal tube, left IJ line, NG tube in stable position. Cardiomegaly. Continued slight improvement of pulmonary interstitial edema. Left lower lobe atelectasis and/or consolidation. Small left pleural effusion cannot be excluded. No pneumothorax.  IMPRESSION: 1. Lines and tubes in stable position. 2. Cardiomegaly with continued slight interim improvement of  pulmonary interstitial edema. Small left pleural effusion cannot be excluded. 3. Dense left lower lobe atelectasis and/or consolidation.   Electronically Signed   By: Maisie Fus  Register   On: 03/29/2015 07:12        ASSESSMENT / PLAN:  PULMONARY Acute respiratory failure post cardiac arrest    - coma precludes extubation  P:   Full vent support  Likely trach 03/31/15 by feinstein   CARDIOVASCULAR Cardiac arrest  Severe cardiomyopathy STEMI Cardiogenic shock    - hypotensive despite fluids with fever. Possible sepsis v herniation v cardiogenic. No evidence of bleeding   P:  Check PCT, lactate, scvo2 STart levophed - maop goal > 65 Tele Heparin gtt, asa  RENAL Recurrent hypokalemia, pulm edema, ef 10% P:   Monitor bmet   GASTROINTESTINAL Tolerating TF P:   SUP: IV PPI  HEMATOLOGIC Anemia, no overt bleding noted  P:  DVT px: full dose heparin Monitor CBC am Transfuse per usual ICU guidelines - hgb goal > 8gm%  INFECTIOUS H Flu pneumonia 03/26/15 (Urine tx positive amphetamines 03/14/2015)   - febrile again 76/6/16 - ? Sepsis v herniation - possibly sepsis  P:   7/2 unasyn>>> 03/30/15 Pan culture 03/30/15 Sepsis biomarkers 03/30/15 Start vanc and zosyn 03/30/15   ENDOCRINE Hyperglycemia  P:   Cont SSI  NEUROLOGIC Acute anoxic encephalopathy - likely severe  Myoclonus - likley poor neuro recovery   - 03/30/15: COma continues. However, corneals +, pupils +. No motor movement though    P:   RASS goal: 0 ativan prn  + versed prn + Depakote Order EEG Order CT head REcalled neuro consult - dave PA 03/30/15 Await neuron specific enolase 03/29/15    FAMILY  - Updates:  Daughter and family in room updated daily; Denied questions 03/29/2015   - Inter-disciplinary family meet or Palliative Care meeting due by:  7/6 - held 03/30/15 with daugther, brother, father of patient 29 year old son (husband is in prison) and sister in law (husband sister) in ICU room. They want  neuro prognosis before any decisisions . Trach and LTAC/snf discussed. NEuro recalled      The patient is critically ill with multiple organ systems failure and requires high complexity decision making for assessment and support, frequent evaluation and titration of therapies, application of advanced monitoring technologies and extensive interpretation of multiple databases.   Critical Care Time devoted to patient care services described in this note is  30  Minutes. This time reflects time of care of this signee Dr Kalman Shan. This critical care time does not reflect procedure time, or teaching time or supervisory time of PA/NP/Med student/Med Resident etc but could involve care discussion time    Dr. Kalman Shan, M.D., [facility].C.P Pulmonary and Critical Care Medicine Staff Physician [place] [facility] [place] Pulmonary and Critical Care Pager: [phone], If no answer or between  15:00h - 7:00h: call [phone]  03/30/2015 10:07 AM

## 2015-03-30 NOTE — Progress Notes (Signed)
ANTIBIOTIC CONSULT NOTE - INITIAL  Pharmacy Consult for vancomycin/zosyn Indication: rule out sepsis  Not on File  Patient Measurements: Height:  (170.2 cm) Weight: 177 lb 11.1 oz (80.6 kg) IBW/kg (Calculated) : 61.6 Adjusted Body Weight: n/a  Vital Signs: Temp: 102 F (38.9 C) (07/06 0700) Temp Source: Axillary (07/06 0700) BP: 118/78 mmHg (07/06 0905) Pulse Rate: 115 (07/06 0905) Intake/Output from previous day: 07/05 0701 - 07/06 0700 In: 3698.3 [I.V.:623.3; NG/GT:1570; IV Piggyback:1505] Out: 5057 [Urine:5057] Intake/Output from this shift:    Labs:  Recent Labs  03/28/15 0430 03/29/15 0453 03/30/15 0450  WBC 30.1* 22.4* 18.6*  HGB 8.0* 8.4* 9.3*  PLT 272 302 365  CREATININE 0.89 1.07* 1.44*   Estimated Creatinine Clearance: 53.3 mL/min (by C-G formula based on Cr of 1.44). No results for input(s): VANCOTROUGH, VANCOPEAK, VANCORANDOM, GENTTROUGH, GENTPEAK, GENTRANDOM, TOBRATROUGH, TOBRAPEAK, TOBRARND, AMIKACINPEAK, AMIKACINTROU, AMIKACIN in the last 72 hours.   Microbiology: Recent Results (from the past 720 hour(s))  Culture, Urine     Status: None   Collection Time: 03/04/2015 10:32 AM  Result Value Ref Range Status   Specimen Description URINE, CATHETERIZED  Final   Special Requests NONE  Final   Culture NO GROWTH 1 DAY  Final   Report Status 03/25/2015 FINAL  Final  Culture, blood (routine x 2)     Status: None   Collection Time: 03/12/2015 10:37 AM  Result Value Ref Range Status   Specimen Description BLOOD RIGHT ANTECUBITAL  Final   Special Requests BOTTLES DRAWN AEROBIC ONLY 5CC  Final   Culture NO GROWTH 5 DAYS  Final   Report Status 03/29/2015 FINAL  Final  MRSA PCR Screening     Status: None   Collection Time: 03/05/2015  2:37 PM  Result Value Ref Range Status   MRSA by PCR NEGATIVE NEGATIVE Final    Comment:        The GeneXpert MRSA Assay (FDA approved for NASAL specimens only), is one component of a comprehensive MRSA  colonization surveillance program. It is not intended to diagnose MRSA infection nor to guide or monitor treatment for MRSA infections.   Culture, blood (routine x 2)     Status: None   Collection Time: 03/23/2015  4:45 PM  Result Value Ref Range Status   Specimen Description BLOOD LEFT HAND  Final   Special Requests BOTTLES DRAWN AEROBIC ONLY 4CC  Final   Culture NO GROWTH 5 DAYS  Final   Report Status 03/29/2015 FINAL  Final  Culture, respiratory (NON-Expectorated)     Status: None   Collection Time: 03/26/15 11:50 AM  Result Value Ref Range Status   Specimen Description TRACHEAL ASPIRATE  Final   Special Requests Normal  Final   Gram Stain   Final    FEW WBC PRESENT,BOTH PMN AND MONONUCLEAR NO SQUAMOUS EPITHELIAL CELLS SEEN MODERATE GRAM NEGATIVE RODS Performed at [facility]    Culture   Final    ABUNDANT HAEMOPHILUS INFLUENZAE Note: BETA LACTAMASE NEGATIVE Performed at [facility]    Report Status 03/28/2015 FINAL  Final    Medical History: Past Medical History  Diagnosis Date  . CHF (congestive heart failure)    Assessment: 49 yo female previously on Unasyn for H.flu PNA, now with fever and hypotension.  Pharmacy asked to broaden antibiotics to vancomycin and zosyn.  Scr up to 1.44 today, est CrCl ~ 53 ml/min.  Goal of Therapy:  Vancomycin trough level 15-20 mcg/ml  Plan:  1. Zosyn 3.375g  IV q 8 hrs (extended-interval infusion) 2. Vancomycin 750 mg IV q 12 hrs. 3. Vancomycin trough at steady state as indicated.  Tad Moore, BCPS  Clinical Pharmacist Pager [phone]  03/30/2015 10:29 AM

## 2015-03-30 NOTE — Progress Notes (Signed)
ANTICOAGULATION CONSULT NOTE - Follow Up Consult  Pharmacy Consult for heparin Indication: LV thrombus  Not on File  Patient Measurements: Height: 5\' 7"  (170.2 cm) Weight: 177 lb 11.1 oz (80.6 kg) IBW/kg (Calculated) : 61.6 Heparin Dosing Weight: ~78 kg  Vital Signs: Temp: 102.6 F (39.2 C) (07/06 1100) Temp Source: Oral (07/06 1100) BP: 80/45 mmHg (07/06 1315) Pulse Rate: 103 (07/06 1315)  Labs:  Recent Labs  03/28/15 0430  03/29/15 0453 03/30/15 0450 03/30/15 1221 03/30/15 1333  HGB 8.0*  --  8.4* 9.3*  --   --   HCT 28.6*  --  30.3* 34.3*  --   --   PLT 272  --  302 365  --   --   HEPARINUNFRC 0.53  < >  --  0.75* 0.67 0.95*  CREATININE 0.89  --  1.07* 1.44*  --   --   < > = values in this interval not displayed.  Estimated Creatinine Clearance: 53.3 mL/min (by C-G formula based on Cr of 1.44).   Assessment: 49 yo female admitted 6/30 with vfib arrest, s/p CPR.  S/p cath lab, hypothermia protocol, suspected LV thrombus on IV heparin.  Heparin level was elevated this AM and rate was reduced to 1600 units/hr.  Follow-up heparin level was drawn by RN at 1220 pm, but then lab could not find blood.  Redrew heparin level at 1330 pm, and now there are 2 heparin level results reported.  However, 1st level is 0.67, and 2nd level is 0.95.  I confirmed with RN, both levels were drawn from central line, and heparin is running peripherally.  I can't explain the discrepancy between the 2 levels.  Either way, heparin level is high.  No bleeding noted.  Goal of Therapy:  Heparin level 0.3-0.7 units/ml Monitor platelets by anticoagulation protocol: Yes   Plan:  1. Decrease IV heparin to 1450 units/hr. 2. Recheck heparin level in 6 hrs. 3. Continue daily heparin level and CBC. 4. F/u eventual conversion to enteral anticoagulation.  Tad Moore, BCPS  Clinical Pharmacist Pager [phone]  03/30/2015 2:39 PM

## 2015-03-30 NOTE — Progress Notes (Signed)
Bedside EEG completed. Results pending.  

## 2015-03-30 NOTE — Progress Notes (Signed)
eLink Physician-Brief Progress Note Patient Name: Anna Kemp DOB: [birth year] MRN: [id number]   Date of Service  03/30/2015  HPI/Events of Note  Sinus tachycardia with HR = 147. Possible DDx: 1. Fever to 102.9 F, 2. LVEF = 15%, 3. Brain Injury or 4. Diuresis. No fever response to Tylenol.   eICU Interventions  Will order: 1. Cooling blanket. 2. Hold Lasix dose pending re-evaluation in the AM.      Intervention Category Intermediate Interventions: Arrhythmia - evaluation and management  [name] 03/30/2015, 5:07 AM

## 2015-03-30 NOTE — Procedures (Signed)
EEG report.  Brief clinical history:  49 y.o. female hx of CHF/cardiomyopathy presented on 6/30 after V fib arrest. Approximately of CPR before ROSC. Underwent hypothermia protocol. Overnight when sedation turned off, patient noted to have muscle spasms in her extremities consistent with myoclonic activity. Became more pronounced and frequent this morning. Given a total of 5mg  ativan and 4mg  of versed with resolution of movements.  EEG on 7/2 --Abnormal EEG due to the presence of a profound generalized cerebral dysfunction (encephalopathy) which can be seen with hypoxic encephalopathy.  CT head 7/3 --Suggestion of mild loss of gray-white differentiation within both cerebral hemispheres, though this is still partially preserved. Thisraises concern for some degree of anoxic brain injury. Associated decreased attenuation at the caudate bilaterally. Mild mass effect, without evidence of midline shift.  Technique: this is a 17 channel routine scalp EEG performed at the bedside in the ICU, with bipolar and monopolar montages arranged in accordance to the international 10/20 system of electrode placement. One channel was dedicated to EKG recording.  The patient is unresponsive, intubated on the vent. On IV Depacon, Midazolam q 4 h. No activating procedures performed.   Description: as the study begins and throughout the entire recording, there is  lack of electrocortical activity.  Impression: this is an abnormal EEG performed in the ICU setting. Although the study was not a dedicated brain death protocol EEG, the findings are consistent with absence of electro cerebral activity. Clinical correlation is advised.   Wyatt Portela, MD

## 2015-03-30 NOTE — Progress Notes (Signed)
Patient ID: Anna Kemp, female   DOB: [birth year], 49 y.o.   MRN: [id number]   SUBJECTIVE: Patient remains intubated with no response to stimulus.  +Myoclonus.  Fever to 102.9 last night and dropped BP.  Received 1000 cc NS.  CVP 9.   Scheduled Meds: . ampicillin-sulbactam (UNASYN) IV  3 g Intravenous Q6H  . antiseptic oral rinse  7 mL Mouth Rinse QID  . aspirin  81 mg Per Tube Daily  . atorvastatin  40 mg Per Tube q1800  . chlorhexidine  15 mL Mouth Rinse BID  . feeding supplement (PRO-STAT 64)  30 mL Per Tube q morning - 10a  . insulin aspart  0-9 Units Subcutaneous 6 times per day  . LORazepam  2 mg Intravenous 4 times per day  . pantoprazole sodium  40 mg Per Tube Daily  . potassium chloride  40 mEq Per Tube Daily  . sodium chloride  3 mL Intravenous Q12H  . sodium chloride  3 mL Intravenous Q12H  . spironolactone  12.5 mg Per Tube Daily  . valproate sodium  500 mg Intravenous Q12H   Continuous Infusions: . feeding supplement (VITAL AF 1.2 CAL) 1,000 mL (03/29/15 2246)  . heparin 1,600 Units/hr (03/30/15 4098)  . midazolam (VERSED) infusion Stopped (03/28/15 1230)  . norepinephrine (LEVOPHED) Adult infusion Stopped (03/27/15 0207)   PRN Meds:.sodium chloride, sodium chloride, acetaminophen, midazolam, ondansetron (ZOFRAN) IV, sodium chloride, sodium chloride   Filed Vitals:   03/30/15 0600 03/30/15 0630 03/30/15 0652 03/30/15 0700  BP: 61/37 68/34 118/66 99/58  Pulse: 123 113 120 127  Temp:    102 F (38.9 C)  TempSrc:    Axillary  Resp: 24 21 25 28   Height:      Weight:      SpO2: 97% 100% 100% 98%    Intake/Output Summary (Last 24 hours) at 03/30/15 0727 Last data filed at 03/30/15 0700  Gross per 24 hour  Intake 3698.3 ml  Output   5057 ml  Net -1358.7 ml    LABS: Basic Metabolic Panel:  Recent Labs  11/91/47 0430 03/29/15 0453 03/30/15 0450  NA 142 147* 155*  K 3.2* 3.6 3.4*  CL 108 111 113*  CO2 24 26 29   GLUCOSE 140* 130* 140*  BUN 22* 25* 32*   CREATININE 0.89 1.07* 1.44*  CALCIUM 8.5* 9.1 9.5  MG 1.8 2.2 2.1  PHOS 2.7  --  4.7*   Liver Function Tests:  Recent Labs  03/28/15 0430 03/29/15 0453  AST 94* 77*  ALT 46 38  ALKPHOS 53 51  BILITOT 1.2 0.9  PROT 6.1* 6.7  ALBUMIN 2.6* 2.6*   No results for input(s): LIPASE, AMYLASE in the last 72 hours. CBC:  Recent Labs  03/28/15 0430 03/29/15 0453 03/30/15 0450  WBC 30.1* 22.4* 18.6*  NEUTROABS 27.1* 18.8*  --   HGB 8.0* 8.4* 9.3*  HCT 28.6* 30.3* 34.3*  MCV 68.6* 70.5* 71.0*  PLT 272 302 365   Cardiac Enzymes: No results for input(s): CKTOTAL, CKMB, CKMBINDEX, TROPONINI in the last 72 hours. BNP: Invalid input(s): POCBNP D-Dimer: No results for input(s): DDIMER in the last 72 hours. Hemoglobin A1C: No results for input(s): HGBA1C in the last 72 hours. Fasting Lipid Panel:  Recent Labs  03/30/15 0450  CHOL 131  HDL 34*  LDLCALC 81  TRIG 79  CHOLHDL 3.9   Thyroid Function Tests:  Recent Labs  03/29/15 1245  TSH 0.273*   Anemia Panel: No results for input(s): VITAMINB12,  FOLATE, FERRITIN, TIBC, IRON, RETICCTPCT in the last 72 hours.  RADIOLOGY: Ct Head Wo Contrast  03/27/2015   CLINICAL DATA:  Acute onset of anoxia.  Initial encounter.  EXAM: CT HEAD WITHOUT CONTRAST  TECHNIQUE: Contiguous axial images were obtained from the base of the skull through the vertex without intravenous contrast.  COMPARISON:  CT of the head performed 04-13-2015  FINDINGS: There is suggestion of mild loss of gray-white differentiation within the cerebral hemispheres, though this is still partially preserved. The cerebellar hemispheres appear grossly unremarkable. Decreased attenuation at the caudate bilaterally could reflect ischemic injury.  Underlying mild mass effect is noted, without evidence of midline shift. The third and lateral ventricles are grossly unremarkable. The fourth ventricle is within normal limits.  There is no evidence of fracture; visualized osseous  structures are unremarkable in appearance. The orbits are within normal limits. The paranasal sinuses and mastoid air cells are well-aerated. No significant soft tissue abnormalities are seen.  IMPRESSION: Suggestion of mild loss of gray-white differentiation within both cerebral hemispheres, though this is still partially preserved. This raises concern for some degree of anoxic brain injury. Associated decreased attenuation at the caudate bilaterally. Mild mass effect, without evidence of midline shift.  These results were called by telephone at the time of interpretation on 03/27/2015 at 10:30 pm to [facility] RN on [facility], who verbally acknowledged these results.   Electronically Signed   By: Roanna Raider M.D.   On: 03/27/2015 22:31   Ct Head Wo Contrast  2015-04-13   CLINICAL DATA:  Post CPR and cardiac arrest, on ventilator  EXAM: CT HEAD WITHOUT CONTRAST  TECHNIQUE: Contiguous axial images were obtained from the base of the skull through the vertex without intravenous contrast.  COMPARISON:  None.  FINDINGS: No skull fracture is noted. No intracranial hemorrhage, mass effect or midline shift. Secretions are noted in nasopharynx.  No hydrocephalus. No intra or extra-axial fluid collection. No acute cortical infarction. No intraventricular hemorrhage. No mass lesion is noted on this unenhanced scan.  IMPRESSION: No intracranial hemorrhage, mass effect or midline shift. No definite acute cortical infarction. No mass lesion is noted on this unenhanced scan.   Electronically Signed   By: Natasha Mead M.D.   On: 04/13/15 11:37   Dg Chest Port 1 View  03/29/2015   CLINICAL DATA:  Cardiac arrest.  EXAM: PORTABLE CHEST - 1 VIEW  COMPARISON:  03/28/2015.  FINDINGS: Endotracheal tube, left IJ line, NG tube in stable position. Cardiomegaly. Continued slight improvement of pulmonary interstitial edema. Left lower lobe atelectasis and/or consolidation. Small left pleural effusion cannot be excluded. No pneumothorax.   IMPRESSION: 1. Lines and tubes in stable position. 2. Cardiomegaly with continued slight interim improvement of pulmonary interstitial edema. Small left pleural effusion cannot be excluded. 3. Dense left lower lobe atelectasis and/or consolidation.   Electronically Signed   By: Maisie Fus  Register   On: 03/29/2015 07:12   Dg Chest Port 1 View  03/28/2015   CLINICAL DATA:  Pulmonary edema  EXAM: PORTABLE CHEST - 1 VIEW  COMPARISON:  03/27/2015  FINDINGS: Cardiomegaly with vascular congestion. Decreasing interstitial prominence, likely improving interstitial edema. Focal opacity in the left lower lobe could represent atelectasis or infiltrate. Probable small left effusion.  Support devices are stable.  IMPRESSION: Improving interstitial edema pattern.  Continued left lower lobe atelectasis or infiltrate with probable small left effusion.   Electronically Signed   By: Charlett Nose M.D.   On: 03/28/2015 07:30   Dg Chest  Port 1 View  03/27/2015   CLINICAL DATA:  CHF.  Admitted with V fib arrest  EXAM: PORTABLE CHEST - 1 VIEW  COMPARISON:  03/26/2015  FINDINGS: Support devices are in stable position. There is stable cardiomegaly. Vascular congestion. Slight interstitial prominence and indistinctness likely reflects interstitial edema. Left lower lobe atelectasis or infiltrate with probable layering left effusion.  IMPRESSION: Probable mild interstitial edema. Continued left lower lobe atelectasis or infiltrate with small left effusion.  Stable cardiomegaly.   Electronically Signed   By: Charlett Nose M.D.   On: 03/27/2015 07:59   Dg Chest Port 1 View  03/26/2015   CLINICAL DATA:  Pulmonary edema.  Intubation.  EXAM: PORTABLE CHEST - 1 VIEW  COMPARISON:  03/08/2015  FINDINGS: Endotracheal tube terminates 5.6 cm above carina. Nasogastric extends beyond the inferior aspect of the film. Left-sided subclavian line terminates at the low SVC. Multiple leads and wires project over the chest, including external third  defibrillator/ pacer. Cardiomegaly accentuated by AP portable technique. Probable left pleural effusion, suboptimally evaluated. No pneumothorax. Low lung volumes with resultant pulmonary interstitial prominence. Left base airspace disease.  IMPRESSION: Cardiomegaly with probable left pleural effusion and adjacent left base airspace disease.  Progressive limitations, as above.  Endotracheal tube appropriately positioned, 5.6 cm above carina.   Electronically Signed   By: Jeronimo Greaves M.D.   On: 03/26/2015 16:16   Dg Chest Portable 1 View  03/23/2015   ADDENDUM REPORT: 02/28/2015 11:45  ADDENDUM: There is a left subclavian central line with tip in SVC right atrium junction. No pneumothorax.   Electronically Signed   By: Natasha Mead M.D.   On: 03/04/2015 11:45   03/23/2015   CLINICAL DATA:  Cold STEMI  EXAM: PORTABLE CHEST - 1 VIEW  COMPARISON:  None.  FINDINGS: Cardiomegaly. Endotracheal tube with tip 4.1 cm above the carina. No acute infiltrate or pulmonary edema. Mild left basilar atelectasis. No pneumothorax.  IMPRESSION: Endotracheal tube with tip 4 cm above the carina. No pneumothorax. Cardiomegaly. Left basilar atelectasis.  Electronically Signed: By: Natasha Mead M.D. On: 02/24/2015 11:31   Dg Abd Portable 1v  03/26/2015   CLINICAL DATA:  OG tube dislodged, replaced.  EXAM: PORTABLE ABDOMEN - 1 VIEW  COMPARISON:  None.  FINDINGS: OG tube tip is in the region of the body of the stomach. Nonobstructive bowel gas pattern. No evidence of free air.  IMPRESSION: OG tube tip in the stomach body.   Electronically Signed   By: Charlett Nose M.D.   On: 03/26/2015 08:14    PHYSICAL EXAM General: Intubated Neck: JVP 8-9 cm, no thyromegaly or thyroid nodule.  Lungs: Coarse BS bilaterally CV: Nondisplaced PMI.  Heart tachy, regular S1/S2, +S3, no murmur.  1+ ankle edema.   Abdomen: Soft, no hepatosplenomegaly, no distention.  Neurologic: Not responsive, myoclonus noted.  Extremities: No clubbing or cyanosis.    TELEMETRY: Reviewed telemetry pt in sinus tachy 120s  ASSESSMENT AND PLAN: 49 yo with history of cardiomyopathy/CHF had ventricular fibrillation arrest and found to have STEMI.  She had CPR and multiple defibrillations, 25 minutes ROSC.  1. Acute MI: Suspect cardioembolism with thrombus in distal LAD. No atherosclerotic coronary disease in other distributions. Unsuccessful PCI. Possibly from small, unrecognized LV thrombus.  - Continue ASA 81, statin.  - Would continue anticoagulation (if we plan to continue aggressive treatment) due to concern for LV thrombus/cardioembolism. Begin transition from heparin to coumadin with overlap whenever CCM ok with [place] starting coumadin => note plan  for tracheostomy on 7/7.  2. Acute on chronic systolic CHF: Patient had pre-existing CMP followed in [place] but I do not have records on this. Apparently no prior CAD. EF 15% with diffuse hypokinesis on echo.  ?Substance abuse-related given amphetamines on admission UDS. Co-ox adequate at 74% yesterday.  Diuresed well yesterday but then dropped BP overnight in setting of fever to 102.9, got NS bolus.  CVP 9.  - Hold Lasix for now but would not give more NS.  BP is stable.  - Stop lisinopril.  3. H influenzae PNA: Febrile overnight, LLL PNA on CXR.  On Unasyn.  Plan per CCM.  4. Anoxic brain injury: Minimal awakening so far, having myoclonus. Followed by neurology. It appears that chance for functional recovery is very low at this point but family has wanted to continue aggressive treatment.  At this point, think situation looks grim.  Need ongoing conversation with family, neurology involvement would likely be helpful. 5. Hypernatremia: Free water boluses and repeat BMET in pm.  Marca Ancona 03/30/2015 7:37 AM

## 2015-03-31 ENCOUNTER — Inpatient Hospital Stay (HOSPITAL_COMMUNITY): Payer: BLUE CROSS/BLUE SHIELD

## 2015-03-31 LAB — BASIC METABOLIC PANEL
Anion gap: 8 (ref 5–15)
BUN: 25 mg/dL — ABNORMAL HIGH (ref 6–20)
CHLORIDE: 115 mmol/L — AB (ref 101–111)
CO2: 28 mmol/L (ref 22–32)
Calcium: 8.8 mg/dL — ABNORMAL LOW (ref 8.9–10.3)
Creatinine, Ser: 1.02 mg/dL — ABNORMAL HIGH (ref 0.44–1.00)
GFR calc Af Amer: >60 mL/min (ref >60)
GFR calc non Af Amer: >60 mL/min (ref >60)
Glucose, Bld: 140 mg/dL — ABNORMAL HIGH (ref 65–99)
POTASSIUM: 3.6 mmol/L (ref 3.5–5.1)
SODIUM: 151 mmol/L — AB (ref 135–145)

## 2015-03-31 LAB — GLUCOSE, CAPILLARY
GLUCOSE-CAPILLARY: 141 mg/dL — AB (ref 65–99)
Glucose-Capillary: 137 mg/dL — ABNORMAL HIGH (ref 65–99)
Glucose-Capillary: 130 mg/dL — ABNORMAL HIGH (ref 65–99)
Glucose-Capillary: 133 mg/dL — ABNORMAL HIGH (ref 65–99)

## 2015-03-31 LAB — CBC
HCT: 29.8 % — ABNORMAL LOW (ref 36.0–46.0)
Hemoglobin: 7.7 g/dL — ABNORMAL LOW (ref 12.0–15.0)
MCH: 19 pg — ABNORMAL LOW (ref 26.0–34.0)
MCHC: 25.8 g/dL — ABNORMAL LOW (ref 30.0–36.0)
MCV: 73.4 fL — ABNORMAL LOW (ref 78.0–100.0)
Platelets: 279 10*3/uL (ref 150–400)
RBC: 4.06 MIL/uL (ref 3.87–5.11)
RDW: 21.5 % — ABNORMAL HIGH (ref 11.5–15.5)
WBC: 17.1 10*3/uL — ABNORMAL HIGH (ref 4.0–10.5)

## 2015-03-31 LAB — CARBOXYHEMOGLOBIN
Carboxyhemoglobin: 2.1 % — ABNORMAL HIGH (ref 0.5–1.5)
Carboxyhemoglobin: 1.5 % (ref 0.5–1.5)
Methemoglobin: 1.1 % (ref 0.0–1.5)
Methemoglobin: 1 % (ref 0.0–1.5)
O2 SAT: 92.6 %
O2 SAT: 78.3 %
TOTAL HEMOGLOBIN: 8 g/dL — AB (ref 12.0–16.0)
Total hemoglobin: 8.9 g/dL — ABNORMAL LOW (ref 12.0–16.0)

## 2015-03-31 LAB — PROCALCITONIN: Procalcitonin: 0.7 ng/mL

## 2015-03-31 LAB — HEPARIN LEVEL (UNFRACTIONATED)
Heparin Unfractionated: 0.54 IU/mL (ref 0.30–0.70)
Heparin Unfractionated: 0.53 IU/mL (ref 0.30–0.70)

## 2015-03-31 LAB — URINE CULTURE
Culture: NO GROWTH
Special Requests: NORMAL

## 2015-03-31 LAB — PHOSPHORUS: PHOSPHORUS: 3.7 mg/dL (ref 2.5–4.6)

## 2015-03-31 LAB — MAGNESIUM: Magnesium: 2.3 mg/dL (ref 1.7–2.4)

## 2015-03-31 MED ORDER — MORPHINE SULFATE 25 MG/ML IV SOLN
10.0000 mg/h | INTRAVENOUS | Status: DC
  Administered 2015-03-31: 5 mg/h via INTRAVENOUS
  Filled 2015-03-31 (×2): qty 10

## 2015-03-31 MED ORDER — MIDAZOLAM BOLUS VIA INFUSION
5.0000 mg | INTRAVENOUS | Status: DC | PRN
  Filled 2015-03-31: qty 20

## 2015-03-31 MED ORDER — CHLORHEXIDINE GLUCONATE 0.12 % MT SOLN
15.0000 mL | Freq: Two times a day (BID) | OROMUCOSAL | Status: DC
  Administered 2015-03-31 – 2015-04-02 (×4): 15 mL via OROMUCOSAL
  Filled 2015-03-31 (×3): qty 15

## 2015-03-31 MED ORDER — MORPHINE BOLUS VIA INFUSION
5.0000 mg | INTRAVENOUS | Status: DC | PRN
  Filled 2015-03-31: qty 20

## 2015-03-31 MED ORDER — CETYLPYRIDINIUM CHLORIDE 0.05 % MT LIQD
7.0000 mL | Freq: Four times a day (QID) | OROMUCOSAL | Status: DC
  Administered 2015-04-01 – 2015-04-02 (×6): 7 mL via OROMUCOSAL

## 2015-03-31 MED ORDER — CETYLPYRIDINIUM CHLORIDE 0.05 % MT LIQD: 7.0000 mL | Freq: Two times a day (BID) | OROMUCOSAL | Status: DC

## 2015-03-31 MED ORDER — MIDAZOLAM HCL 5 MG/ML IJ SOLN
10.0000 mg/h | INTRAMUSCULAR | Status: DC
  Administered 2015-03-31: 5 mg/h via INTRAVENOUS
  Administered 2015-04-01 (×3): 10 mg/h via INTRAVENOUS
  Administered 2015-04-02: 6 mg/h via INTRAVENOUS
  Administered 2015-04-02: 5 mg/h via INTRAVENOUS
  Filled 2015-03-31 (×8): qty 10

## 2015-03-31 NOTE — Progress Notes (Signed)
ANTICOAGULATION CONSULT NOTE - Follow Up Consult  Pharmacy Consult for heparin Indication: LV thrombus   Labs:  Recent Labs  03/29/15 0453 03/30/15 0450  03/30/15 1333 03/30/15 1530 03/30/15 2045 03/31/15 0400  HGB 8.4* 9.3*  --   --   --   --   --   HCT 30.3* 34.3*  --   --   --   --   --   PLT 302 365  --   --   --   --   --   HEPARINUNFRC  --  0.75*  < > 0.95*  --  0.75* 0.54  CREATININE 1.07* 1.44*  --   --  1.19*  --   --   < > = values in this interval not displayed.    Assessment/Plan:  49yo female therapeutic on heparin after rate adjustments. Will continue gtt at current rate and confirm stable with additional level.   Vernard Gambles, PharmD, BCPS  03/31/2015,5:42 AM

## 2015-03-31 NOTE — Progress Notes (Signed)
Patient ID: Anna Kemp, female   DOB: 1965/11/18, 49 y.o.   MRN: [id number] r I have had extensive discussions with family daughter and boyfriend together. We discussed patients current circumstances and organ failures. We also discussed patient's prior wishes under circumstances such as this. Family has decided to NOT perform resuscitation and want comfort meds started now.  They want comfort once family from [place] arrives, likely sat am  Mcarthur Rossetti. Tyson Alias, MD, FACP Pgr: [phone] [facility]

## 2015-03-31 NOTE — Progress Notes (Signed)
PULMONARY / CRITICAL CARE MEDICINE   Name: Areial Boysen MRN: [id number] DOB: [birth year]    ADMISSION DATE:  02/26/2015  REFERRING MD :  EDP  CHIEF COMPLAINT:  Post arrest   INITIAL PRESENTATION:  49yo female with hx cardiomyopathy presented 6/30 after VFib arrest.  Approx 25 minutes CPR.  Evaluated in ER and code STEMI called, pt taken emergently to cath lab.  PCCM called to admit for hypothermia protocol.   INDWELLING DEVICES:: ETT 6/30 >>  L Elbert CVL 6/30 >>   MICRO DATA: Urine 6/30 >>  Blood 6/30 >>   ANTIMICROBIALS:    MAJOR EVENTS/TEST RESULTS: 6/30 Admitted to CCU after prolonged cardiac arrest. Hypothermia protocol initiated 6/30 CT head: NAICP 6/30 LHC: Normal coronary arteries except for thrombotic apical occlusion of LAD. Very severe global LV systolic dysfunction with LVEF 10%. Unsuccessful PCI of the apical LAD with attempted aspiration thrombectomy and PTCA. 7/01 EEG: consistent with a profound generalized cerebral dysfunction(encephalopathy) as can be seen with hypoxic encephalopathy. The presence of sedating medication and hypothermia does make this recording less definite for prognostication. There was no seizure or seizure predisposition recorded on this study 7/01 TTE: LVEF 15%. Grade 2 DD 7/2- myoclonus 7/2 eeg-Abnormal EEG due to the presence of a profound generalized cerebral dysfunction (encephalopathy) which can be seen with hypoxic encephalopathy 7/3 CT head>>>mild loss of gray-white differentiation within both cerebral hemispheres, though this is still partially preserved. This raises concern for some degree of anoxic brain injury. Associated decreased attenuation at the caudate bilaterally. Mild mass effect 03/28/15: Neg 1 liter, CT head anoxia  03/29/15: Comatose. Pupil+, Gag  +..Family at bedside - crying . Cards recommending CHF Service. 7/6 CT >>>anoxia, no changes EEG 7/6- no electrical acvivity 7/7- no pressors   SUBJECTIVE/OVERNIGHT/INTERVAL  HX EEG flat  VITAL SIGNS: Temp:  [97.9 F (36.6 C)-100.8 F (38.2 C)] 98.9 F (37.2 C) (07/07 0725) Pulse Rate:  [86-119] 103 (07/07 0900) Resp:  [16-28] 16 (07/07 0900) BP: (65-127)/(43-83) 102/69 mmHg (07/07 0900) SpO2:  [98 %-100 %] 100 % (07/07 0900) FiO2 (%):  [40 %] 40 % (07/07 0814) Weight:  [79.8 kg (175 lb 14.8 oz)] 79.8 kg (175 lb 14.8 oz) (07/07 0353) HEMODYNAMICS: CVP:  [4 mmHg-9 mmHg] 9 mmHg VENTILATOR SETTINGS: Vent Mode:  [-] PRVC FiO2 (%):  [40 %] 40 % Set Rate:  [16 bmp] 16 bmp Vt Set:  [500 mL] 500 mL PEEP:  [5 cmH20] 5 cmH20 Plateau Pressure:  [18 cmH20-21 cmH20] 20 cmH20 INTAKE / OUTPUT:  Intake/Output Summary (Last 24 hours) at 03/31/15 1106 Last data filed at 03/31/15 0900  Gross per 24 hour  Intake 2802.28 ml  Output   1245 ml  Net 1557.28 ml    PHYSICAL EXAMINATION: General: Looks critically ill Neuro:COmatose . Gag NO,. Pupil + 42mm , Corneal +  not follow commands, strong cough,eyes deviated downward HEENT: jvd within Cardiovascular: s1 s2 TRR 112, does have some HR variability Lungs: rochi insp mild, less coarse Abdomen:  Soft, BS wnl, no r/g Ext: cool, no edema    LABS: PULMONARY  Recent Labs Lab 03/16/2015 1128  02/26/2015 1545 03/21/2015 1638 03/23/2015 1842 03/26/15 0956 03/27/15 0530 03/29/15 1250 03/29/15 1344 03/30/15 1055 03/31/15 0532 03/31/15 0650  PHART 7.243*  --  7.336*  --   --  7.327* 7.435  --   --   --   --   --   PCO2ART 45.2*  --  38.4  --   --  32.8* 27.5*  --   --   --   --   --  PO2ART 135.0*  --  295.0*  --   --  84.0 110*  --   --   --   --   --   HCO3 19.6*  --  20.9  --   --  17.2* 18.3*  --   --   --   --   --   TCO2 21  < > 22 19 20 18  19.1  --   --   --   --   --   O2SAT 99.0  --  100.0  --   --  96.0 98.8 87.1 73.9 71.8 92.6 78.3  < > = values in this interval not displayed.  CBC  Recent Labs Lab 03/29/15 0453 03/30/15 0450 03/31/15 0400  HGB 8.4* 9.3* 7.7*  HCT 30.3* 34.3* 29.8*  WBC 22.4*  18.6* 17.1*  PLT 302 365 279    COAGULATION  Recent Labs Lab 03/20/2015 1839 03/22/2015 2225  INR 1.60* 1.95*    CARDIAC    Recent Labs Lab 03/20/2015 1435 02/26/2015 2225 03/25/15 0400  TROPONINI 18.61* 40.99* 35.58*   No results for input(s): PROBNP in the last 168 hours.   CHEMISTRY  Recent Labs Lab 03/26/15 0446 03/27/15 0517 03/28/15 0430 03/29/15 0453 03/30/15 0450 03/30/15 1530 03/31/15 0400  NA 139 142 142 147* 155* 153* 151*  K 3.4* 3.6 3.2* 3.6 3.4* 3.5 3.6  CL 109 111 108 111 113* 119* 115*  CO2 19* 21* 24 26 29 27 28   GLUCOSE 116* 92 140* 130* 140* 140* 140*  BUN 19 20 22* 25* 32* 28* 25*  CREATININE 1.01* 0.83 0.89 1.07* 1.44* 1.19* 1.02*  CALCIUM 8.3* 8.5* 8.5* 9.1 9.5 8.2* 8.8*  MG 1.9 2.0 1.8 2.2 2.1  --  2.3  PHOS 4.3 3.1 2.7  --  4.7*  --  3.7   Estimated Creatinine Clearance: 75 mL/min (by C-G formula based on Cr of 1.02).   LIVER  Recent Labs Lab 03/08/2015 1839 03/08/2015 2225 03/27/15 0517 03/28/15 0430 03/29/15 0453  AST  --   --  129* 94* 77*  ALT  --   --  57* 46 38  ALKPHOS  --   --  51 53 51  BILITOT  --   --  1.2 1.2 0.9  PROT  --   --  5.9* 6.1* 6.7  ALBUMIN  --   --  2.7* 2.6* 2.6*  INR 1.60* 1.95*  --   --   --      INFECTIOUS  Recent Labs Lab 03/30/15 1051 03/31/15 0400  LATICACIDVEN 1.3  --   PROCALCITON 1.02 0.70     ENDOCRINE CBG (last 3)   Recent Labs  03/30/15 2344 03/31/15 0355 03/31/15 0724  GLUCAP 137* 130* 133*         IMAGING x48h  - image(s) personally visualized  -   highlighted in bold Ct Head Wo Contrast  03/30/2015   CLINICAL DATA:  49 year old female status post cardiac arrest. Nonresponsive.  EXAM: CT HEAD WITHOUT CONTRAST  TECHNIQUE: Contiguous axial images were obtained from the base of the skull through the vertex without intravenous contrast.  COMPARISON:  Head CT 03/27/2015.  FINDINGS: Again noted is diffuse loss of gray-white differentiation throughout the cerebral hemispheres  bilaterally (right greater than left), with some sulcal effacement, particularly overlying the right cerebral hemisphere, suggesting a background of cerebral edema, potentially related to anoxic brain injury. No acute intracranial hemorrhage, mass, hydrocephalus or abnormal intra or extra-axial fluid collections. Visualized paranasal  sinuses and mastoids are well pneumatized. No acute displaced skull fractures.  IMPRESSION: 1. Findings remain concerning for potential anoxic brain injury, as above. No significant change compared to prior study 03/27/2015.   Electronically Signed   By: Trudie Reed M.D.   On: 03/30/2015 14:50   Dg Chest Port 1 View  03/31/2015   CLINICAL DATA:  Respiratory failure.  EXAM: PORTABLE CHEST - 1 VIEW  COMPARISON:  03/30/2015.  FINDINGS: Endotracheal tube, left subclavian line, NG tube in stable position. Stable cardiomegaly. Interim partial clearing of left lower lobe atelectasis and consolidation. Small left pleural effusion cannot be excluded. No pneumothorax.  IMPRESSION: 1.  Lines and tubes in stable position.  2. Interim partial complete clearing of left lower lobe atelectasis and consolidation . Small left pleural effusion cannot be excluded.  3.  Stable cardiomegaly.   Electronically Signed   By: Maisie Fus  Register   On: 03/31/2015 07:36   Dg Chest Port 1 View  03/30/2015   CLINICAL DATA:  Hypoxia and sepsis  EXAM: PORTABLE CHEST - 1 VIEW  COMPARISON:  March 29, 2015  FINDINGS: Endotracheal tube tip is 6.1 cm above the carina. Central catheter tip is in the superior vena cava. Nasogastric tube tip and side port are below the diaphragm. No pneumothorax. There is persistent left lower lobe consolidation with volume loss. There is a small left effusion. The right lung is clear. Heart is mildly enlarged, stable. The pulmonary vascularity is normal. No adenopathy.  IMPRESSION: Persistent left lower lobe consolidation with volume loss. Small left effusion. No new opacity in the lungs.  Stable cardiomegaly. Tube and catheter positions as described without pneumothorax.   Electronically Signed   By: Bretta Bang III M.D.   On: 03/30/2015 11:17        ASSESSMENT / PLAN:  PULMONARY Acute respiratory failure post cardiac arrest    - coma precludes extubation  P:   Wean sbt attempt, high PS   CARDIOVASCULAR Cardiac arrest  Severe cardiomyopathy STEMI Cardiogenic shock  P:  STart levophed - maop goal > 60 Tele Heparin gtt, asa  RENAL Recurrent hypokalemia, pulm edema, ef 10% P:   Monitor bmet Avoid free water   GASTROINTESTINAL Tolerating TF P:   SUP: IV PPI Monitor for BM  HEMATOLOGIC Anemia, no overt bleding noted  P:  DVT px: full dose heparin Monitor CBC am Transfuse per usual ICU guidelines - hgb goal > 8gm%  INFECTIOUS H Flu pneumonia 03/26/15 (Urine tx positive amphetamines 03/07/2015)   - febrile again 76/6/16 - ? Sepsis v herniation - possibly sepsis  P:   7/2 unasyn>>> 03/30/15 Pan culture 03/30/15 Sepsis biomarkers 03/30/15 Start vanc and zosyn 03/30/15>>>likley can dc Fevers neuro?   ENDOCRINE Hyperglycemia  P:   Cont SSI  NEUROLOGIC Acute anoxic encephalopathy - likely severe  Myoclonus - likley poor neuro recovery   - 03/30/15: COma continues. However, corneals +, pupils +. No motor movement though    P:   Poor prognosis Family does not want heroics /trach They have approved start versed, morphine as family gathers DNR, comfort in next 48 hrs    FAMILY  - Updates:  Daughter and family in room updated daily; Denied questions 03/29/2015, see progress for discussion had   - Inter-disciplinary family meet or Palliative Care meeting due by:  7/6 - held 03/30/15 with daugther, brother, father of patient 2 year old son (husband is in prison) and sister in law (husband sister) in ICU room. They want neuro  prognosis before any decisisions . Trach and LTAC/snf discussed. NEuro recalled   Ccm time 30 min   Mcarthur Rossetti.  Tyson Alias, MD, FACP Pgr: [phone] [facility]

## 2015-03-31 NOTE — Progress Notes (Signed)
ANTICOAGULATION CONSULT NOTE - Follow Up Consult  Pharmacy Consult for heparin Indication: LV thrombus  No Known Allergies  Patient Measurements: Height: 5\' 7"  (170.2 cm) Weight: 175 lb 14.8 oz (79.8 kg) IBW/kg (Calculated) : 61.6 Heparin Dosing Weight: ~78 kg  Vital Signs: Temp: 102.3 F (39.1 C) (07/07 1138) Temp Source: Oral (07/07 1138) BP: 102/69 mmHg (07/07 0900) Pulse Rate: 113 (07/07 1241)  Labs:  Recent Labs  03/29/15 0453 03/30/15 0450  03/30/15 1530 03/30/15 2045 03/31/15 0400 03/31/15 1130  HGB 8.4* 9.3*  --   --   --  7.7*  --   HCT 30.3* 34.3*  --   --   --  29.8*  --   PLT 302 365  --   --   --  279  --   HEPARINUNFRC  --  0.75*  < >  --  0.75* 0.54 0.53  CREATININE 1.07* 1.44*  --  1.19*  --  1.02*  --   < > = values in this interval not displayed.  Estimated Creatinine Clearance: 75 mL/min (by C-G formula based on Cr of 1.02).   Assessment: 49 yo female admitted 6/30 with vfib arrest, s/p CPR.  S/p cath lab, hypothermia protocol, suspected LV thrombus on IV heparin.  Heparin level at goal on 1350 units/hr. No bleeding noted.  Goal of Therapy:  Heparin level 0.3-0.7 units/ml Monitor platelets by anticoagulation protocol: Yes   Plan:  1. Continue IV heparin at 1350 units/hr. 2. Continue daily heparin level and CBC.   Sheppard Coil PharmD., BCPS Clinical Pharmacist Pager [phone] 03/31/2015 2:13 PM

## 2015-03-31 NOTE — Progress Notes (Signed)
   03/31/15 1300  Clinical Encounter Type  Visited With Patient and family together;Health care provider  Visit Type Initial;Spiritual support;Social support;Critical Care;Patient actively dying  Referral From Physician  Spiritual Encounters  Spiritual Needs Emotional;Grief support  Stress Factors  Family Stress Factors Loss   Chaplain was referred to patient via spiritual care consult. When chaplain arrived, several family members, including a son and daughter, were at bedside. Chaplain was present while a family friend prayed. Patient's family is having a difficult time with the situation but seem to be supporting each other well. Family is deeply spiritual and they are finding comfort and strength in interpreting this experience through their faith. No further support needed at this time. Chaplain has made patient's family aware that chaplain support is available 24/7. Page Merrilyn Puma chaplain for further support needs.  Brigetta Beckstrom, Tommi Emery, Chaplain  1:30 PM

## 2015-03-31 NOTE — Progress Notes (Signed)
Daughter/family expressed wishes and understanding of comfort care/DNR status to RN and MD.   Discontinued all previous orders including medications, enteral feeding, intravenous drips, blood sugar monitoring, radiographs, and laboratory tests per order.   Morphine and Versed drip started for comfort.   Will continue to monitor and keep comfortable.   Rise Paganini, RN

## 2015-03-31 NOTE — Progress Notes (Signed)
Patient ID: Anna Kemp, female   DOB: [birth year], 49 y.o.   MRN: [id number]   SUBJECTIVE: Patient remains intubated with no response to stimulus.  +Myoclonus.  She is now on low dose norepinephrine.  Last CVP 9 with co-ox 78%.  EEG yesterday with findings consistent with absence of electro-cerebral activity.   Scheduled Meds: . antiseptic oral rinse  7 mL Mouth Rinse QID  . aspirin  81 mg Per Tube Daily  . atorvastatin  40 mg Per Tube q1800  . chlorhexidine  15 mL Mouth Rinse BID  . feeding supplement (PRO-STAT 64)  30 mL Per Tube q morning - 10a  . free water  300 mL Per Tube Q6H  . insulin aspart  0-9 Units Subcutaneous 6 times per day  . LORazepam  2 mg Intravenous 4 times per day  . pantoprazole sodium  40 mg Per Tube Daily  . piperacillin-tazobactam (ZOSYN)  IV  3.375 g Intravenous 3 times per day  . potassium chloride  40 mEq Per Tube Daily  . sodium chloride  3 mL Intravenous Q12H  . sodium chloride  3 mL Intravenous Q12H  . valproate sodium  500 mg Intravenous Q12H  . vancomycin  750 mg Intravenous Q12H   Continuous Infusions: . feeding supplement (VITAL AF 1.2 CAL) 1,000 mL (03/30/15 2000)  . heparin 1,450 Units/hr (03/30/15 2000)  . norepinephrine (LEVOPHED) Adult infusion 4 mcg/min (03/31/15 0036)   PRN Meds:.sodium chloride, sodium chloride, acetaminophen, midazolam, ondansetron (ZOFRAN) IV, sodium chloride, sodium chloride   Filed Vitals:   03/31/15 0400 03/31/15 0500 03/31/15 0600 03/31/15 0725  BP: 114/81 109/70 112/71 114/72  Pulse: 112 114 111 112  Temp:    98.9 F (37.2 C)  TempSrc:    Oral  Resp: 20 20 20 22   Height:      Weight:      SpO2: 100% 100% 100% 100%    Intake/Output Summary (Last 24 hours) at 03/31/15 0815 Last data filed at 03/31/15 0600  Gross per 24 hour  Intake 3171.78 ml  Output   1470 ml  Net 1701.78 ml    LABS: Basic Metabolic Panel:  Recent Labs  74/82/70 0450 03/30/15 1530 03/31/15 0400  NA 155* 153* 151*  K 3.4* 3.5  3.6  CL 113* 119* 115*  CO2 29 27 28   GLUCOSE 140* 140* 140*  BUN 32* 28* 25*  CREATININE 1.44* 1.19* 1.02*  CALCIUM 9.5 8.2* 8.8*  MG 2.1  --  2.3  PHOS 4.7*  --  3.7   Liver Function Tests:  Recent Labs  03/29/15 0453  AST 77*  ALT 38  ALKPHOS 51  BILITOT 0.9  PROT 6.7  ALBUMIN 2.6*   No results for input(s): LIPASE, AMYLASE in the last 72 hours. CBC:  Recent Labs  03/29/15 0453 03/30/15 0450 03/31/15 0400  WBC 22.4* 18.6* 17.1*  NEUTROABS 18.8*  --   --   HGB 8.4* 9.3* 7.7*  HCT 30.3* 34.3* 29.8*  MCV 70.5* 71.0* 73.4*  PLT 302 365 279   Cardiac Enzymes: No results for input(s): CKTOTAL, CKMB, CKMBINDEX, TROPONINI in the last 72 hours. BNP: Invalid input(s): POCBNP D-Dimer: No results for input(s): DDIMER in the last 72 hours. Hemoglobin A1C: No results for input(s): HGBA1C in the last 72 hours. Fasting Lipid Panel:  Recent Labs  03/30/15 0450  CHOL 131  HDL 34*  LDLCALC 81  TRIG 79  CHOLHDL 3.9   Thyroid Function Tests:  Recent Labs  03/29/15 1245  TSH 0.273*   Anemia Panel: No results for input(s): VITAMINB12, FOLATE, FERRITIN, TIBC, IRON, RETICCTPCT in the last 72 hours.  RADIOLOGY: Ct Head Wo Contrast  03/30/2015   CLINICAL DATA:  49 year old female status post cardiac arrest. Nonresponsive.  EXAM: CT HEAD WITHOUT CONTRAST  TECHNIQUE: Contiguous axial images were obtained from the base of the skull through the vertex without intravenous contrast.  COMPARISON:  Head CT 03/27/2015.  FINDINGS: Again noted is diffuse loss of gray-white differentiation throughout the cerebral hemispheres bilaterally (right greater than left), with some sulcal effacement, particularly overlying the right cerebral hemisphere, suggesting a background of cerebral edema, potentially related to anoxic brain injury. No acute intracranial hemorrhage, mass, hydrocephalus or abnormal intra or extra-axial fluid collections. Visualized paranasal sinuses and mastoids are well  pneumatized. No acute displaced skull fractures.  IMPRESSION: 1. Findings remain concerning for potential anoxic brain injury, as above. No significant change compared to prior study 03/27/2015.   Electronically Signed   By: Trudie Reed M.D.   On: 03/30/2015 14:50   Ct Head Wo Contrast  03/27/2015   CLINICAL DATA:  Acute onset of anoxia.  Initial encounter.  EXAM: CT HEAD WITHOUT CONTRAST  TECHNIQUE: Contiguous axial images were obtained from the base of the skull through the vertex without intravenous contrast.  COMPARISON:  CT of the head performed 15-Apr-2015  FINDINGS: There is suggestion of mild loss of gray-white differentiation within the cerebral hemispheres, though this is still partially preserved. The cerebellar hemispheres appear grossly unremarkable. Decreased attenuation at the caudate bilaterally could reflect ischemic injury.  Underlying mild mass effect is noted, without evidence of midline shift. The third and lateral ventricles are grossly unremarkable. The fourth ventricle is within normal limits.  There is no evidence of fracture; visualized osseous structures are unremarkable in appearance. The orbits are within normal limits. The paranasal sinuses and mastoid air cells are well-aerated. No significant soft tissue abnormalities are seen.  IMPRESSION: Suggestion of mild loss of gray-white differentiation within both cerebral hemispheres, though this is still partially preserved. This raises concern for some degree of anoxic brain injury. Associated decreased attenuation at the caudate bilaterally. Mild mass effect, without evidence of midline shift.  These results were called by telephone at the time of interpretation on 03/27/2015 at 10:30 pm to [facility] RN on [facility], who verbally acknowledged these results.   Electronically Signed   By: Roanna Raider M.D.   On: 03/27/2015 22:31   Ct Head Wo Contrast  04-15-15   CLINICAL DATA:  Post CPR and cardiac arrest, on ventilator  EXAM: CT  HEAD WITHOUT CONTRAST  TECHNIQUE: Contiguous axial images were obtained from the base of the skull through the vertex without intravenous contrast.  COMPARISON:  None.  FINDINGS: No skull fracture is noted. No intracranial hemorrhage, mass effect or midline shift. Secretions are noted in nasopharynx.  No hydrocephalus. No intra or extra-axial fluid collection. No acute cortical infarction. No intraventricular hemorrhage. No mass lesion is noted on this unenhanced scan.  IMPRESSION: No intracranial hemorrhage, mass effect or midline shift. No definite acute cortical infarction. No mass lesion is noted on this unenhanced scan.   Electronically Signed   By: Natasha Mead M.D.   On: April 15, 2015 11:37   Dg Chest Port 1 View  03/31/2015   CLINICAL DATA:  Respiratory failure.  EXAM: PORTABLE CHEST - 1 VIEW  COMPARISON:  03/30/2015.  FINDINGS: Endotracheal tube, left subclavian line, NG tube in stable position. Stable cardiomegaly. Interim partial clearing of left  lower lobe atelectasis and consolidation. Small left pleural effusion cannot be excluded. No pneumothorax.  IMPRESSION: 1.  Lines and tubes in stable position.  2. Interim partial complete clearing of left lower lobe atelectasis and consolidation . Small left pleural effusion cannot be excluded.  3.  Stable cardiomegaly.   Electronically Signed   By: Maisie Fus  Register   On: 03/31/2015 07:36   Dg Chest Port 1 View  03/30/2015   CLINICAL DATA:  Hypoxia and sepsis  EXAM: PORTABLE CHEST - 1 VIEW  COMPARISON:  March 29, 2015  FINDINGS: Endotracheal tube tip is 6.1 cm above the carina. Central catheter tip is in the superior vena cava. Nasogastric tube tip and side port are below the diaphragm. No pneumothorax. There is persistent left lower lobe consolidation with volume loss. There is a small left effusion. The right lung is clear. Heart is mildly enlarged, stable. The pulmonary vascularity is normal. No adenopathy.  IMPRESSION: Persistent left lower lobe consolidation  with volume loss. Small left effusion. No new opacity in the lungs. Stable cardiomegaly. Tube and catheter positions as described without pneumothorax.   Electronically Signed   By: Bretta Bang III M.D.   On: 03/30/2015 11:17   Dg Chest Port 1 View  03/29/2015   CLINICAL DATA:  Cardiac arrest.  EXAM: PORTABLE CHEST - 1 VIEW  COMPARISON:  03/28/2015.  FINDINGS: Endotracheal tube, left IJ line, NG tube in stable position. Cardiomegaly. Continued slight improvement of pulmonary interstitial edema. Left lower lobe atelectasis and/or consolidation. Small left pleural effusion cannot be excluded. No pneumothorax.  IMPRESSION: 1. Lines and tubes in stable position. 2. Cardiomegaly with continued slight interim improvement of pulmonary interstitial edema. Small left pleural effusion cannot be excluded. 3. Dense left lower lobe atelectasis and/or consolidation.   Electronically Signed   By: Maisie Fus  Register   On: 03/29/2015 07:12   Dg Chest Port 1 View  03/28/2015   CLINICAL DATA:  Pulmonary edema  EXAM: PORTABLE CHEST - 1 VIEW  COMPARISON:  03/27/2015  FINDINGS: Cardiomegaly with vascular congestion. Decreasing interstitial prominence, likely improving interstitial edema. Focal opacity in the left lower lobe could represent atelectasis or infiltrate. Probable small left effusion.  Support devices are stable.  IMPRESSION: Improving interstitial edema pattern.  Continued left lower lobe atelectasis or infiltrate with probable small left effusion.   Electronically Signed   By: Charlett Nose M.D.   On: 03/28/2015 07:30   Dg Chest Port 1 View  03/27/2015   CLINICAL DATA:  CHF.  Admitted with V fib arrest  EXAM: PORTABLE CHEST - 1 VIEW  COMPARISON:  03/26/2015  FINDINGS: Support devices are in stable position. There is stable cardiomegaly. Vascular congestion. Slight interstitial prominence and indistinctness likely reflects interstitial edema. Left lower lobe atelectasis or infiltrate with probable layering left  effusion.  IMPRESSION: Probable mild interstitial edema. Continued left lower lobe atelectasis or infiltrate with small left effusion.  Stable cardiomegaly.   Electronically Signed   By: Charlett Nose M.D.   On: 03/27/2015 07:59   Dg Chest Port 1 View  03/26/2015   CLINICAL DATA:  Pulmonary edema.  Intubation.  EXAM: PORTABLE CHEST - 1 VIEW  COMPARISON:  03/18/2015  FINDINGS: Endotracheal tube terminates 5.6 cm above carina. Nasogastric extends beyond the inferior aspect of the film. Left-sided subclavian line terminates at the low SVC. Multiple leads and wires project over the chest, including external third defibrillator/ pacer. Cardiomegaly accentuated by AP portable technique. Probable left pleural effusion, suboptimally evaluated. No pneumothorax.  Low lung volumes with resultant pulmonary interstitial prominence. Left base airspace disease.  IMPRESSION: Cardiomegaly with probable left pleural effusion and adjacent left base airspace disease.  Progressive limitations, as above.  Endotracheal tube appropriately positioned, 5.6 cm above carina.   Electronically Signed   By: Jeronimo Greaves M.D.   On: 03/26/2015 16:16   Dg Chest Portable 1 View  2015/04/22   ADDENDUM REPORT: 04-22-15 11:45  ADDENDUM: There is a left subclavian central line with tip in SVC right atrium junction. No pneumothorax.   Electronically Signed   By: Natasha Mead M.D.   On: 2015-04-22 11:45   22-Apr-2015   CLINICAL DATA:  Cold STEMI  EXAM: PORTABLE CHEST - 1 VIEW  COMPARISON:  None.  FINDINGS: Cardiomegaly. Endotracheal tube with tip 4.1 cm above the carina. No acute infiltrate or pulmonary edema. Mild left basilar atelectasis. No pneumothorax.  IMPRESSION: Endotracheal tube with tip 4 cm above the carina. No pneumothorax. Cardiomegaly. Left basilar atelectasis.  Electronically Signed: By: Natasha Mead M.D. On: 22-Apr-2015 11:31   Dg Abd Portable 1v  03/26/2015   CLINICAL DATA:  OG tube dislodged, replaced.  EXAM: PORTABLE ABDOMEN - 1 VIEW   COMPARISON:  None.  FINDINGS: OG tube tip is in the region of the body of the stomach. Nonobstructive bowel gas pattern. No evidence of free air.  IMPRESSION: OG tube tip in the stomach body.   Electronically Signed   By: Charlett Nose M.D.   On: 03/26/2015 08:14    PHYSICAL EXAM General: Intubated Neck: JVP 8-9 cm, no thyromegaly or thyroid nodule.  Lungs: Coarse BS bilaterally CV: Nondisplaced PMI.  Heart tachy, regular S1/S2, +S3, no murmur.  1+ ankle edema.   Abdomen: Soft, no hepatosplenomegaly, no distention.  Neurologic: Not responsive, myoclonus noted.  Extremities: No clubbing or cyanosis.   TELEMETRY: Reviewed telemetry pt in sinus tachy 110s  ASSESSMENT AND PLAN: 49 yo with history of cardiomyopathy/CHF had ventricular fibrillation arrest and found to have STEMI.  She had CPR and multiple defibrillations, 25 minutes ROSC.  1. Acute MI: Suspect cardioembolism with thrombus in distal LAD. No atherosclerotic coronary disease in other distributions. Unsuccessful PCI. Possibly from small, unrecognized LV thrombus.  - Continue ASA 81, statin.  - Would continue anticoagulation (if we plan to continue aggressive treatment) due to concern for LV thrombus/cardioembolism. Begin transition from heparin to coumadin with overlap whenever CCM ok with starting coumadin => note possible plan for tracheostomy on 7/7.  2. Acute on chronic systolic CHF: Patient had pre-existing CMP followed in [place] but I do not have records on this. Apparently no prior CAD. EF 15% with diffuse hypokinesis on echo.  ?Substance abuse-related given amphetamines on admission UDS. Co-ox adequate at 78% today.  CVP 9 currently.  Now off Lasix and on low-dose norepinephrine with fall in BP in setting of fever/suspected vasodilation. 3. H influenzae PNA: Febrile overnight, LLL PNA on CXR.  On Unasyn.  Plan per CCM.  4. Anoxic brain injury: Minimal awakening so far, having myoclonus. EEG yesterday with absence of  electro-cerebral activity. It appears that chance for functional recovery is very low at this point.  Need ongoing conversation with family, neurology involvement would likely be helpful. 5. Hypernatremia: Free water boluses.   I do not think that we have much left to offer at this time. Will sign off, call with questions.   Marca Ancona 03/31/2015 8:15 AM

## 2015-04-01 LAB — CULTURE, RESPIRATORY W GRAM STAIN

## 2015-04-01 LAB — CULTURE, RESPIRATORY
CULTURE: NO GROWTH
Special Requests: NORMAL

## 2015-04-01 MED ORDER — LORAZEPAM 2 MG/ML IJ SOLN
INTRAMUSCULAR | Status: AC
  Filled 2015-04-01: qty 3

## 2015-04-01 MED ORDER — DEXTROSE 5 % IV SOLN
1500.0000 mg | Freq: Once | INTRAVENOUS | Status: AC
  Administered 2015-04-01: 1500 mg via INTRAVENOUS
  Filled 2015-04-01: qty 15

## 2015-04-01 MED ORDER — LORAZEPAM 2 MG/ML IJ SOLN
5.0000 mg | INTRAMUSCULAR | Status: DC | PRN
  Administered 2015-04-01: 5 mg via INTRAVENOUS
  Filled 2015-04-01: qty 3

## 2015-04-01 MED ORDER — VALPROATE SODIUM 500 MG/5ML IV SOLN
250.0000 mg | Freq: Four times a day (QID) | INTRAVENOUS | Status: DC
  Filled 2015-04-01: qty 2.5

## 2015-04-01 MED ORDER — LORAZEPAM 2 MG/ML IJ SOLN
5.0000 mg | Freq: Once | INTRAMUSCULAR | Status: AC
  Administered 2015-04-01: 5 mg via INTRAVENOUS

## 2015-04-01 MED ORDER — VALPROATE SODIUM 500 MG/5ML IV SOLN
500.0000 mg | Freq: Two times a day (BID) | INTRAVENOUS | Status: DC
  Filled 2015-04-01: qty 5

## 2015-04-01 NOTE — Progress Notes (Signed)
75cc's of morphine 250mg  in D5 wasted in sink with Stanton Kidney as a witness.  Cosigned by: Stanton Kidney, RN

## 2015-04-01 NOTE — Progress Notes (Signed)
Family approached RN very upset. Family upset that tube feeds were stopped. Explained to family that this was a part of the comfort measures that they agreed upon on yesterday. Patient daughter stating that we are "trying to push her mother out." and that nothing was supposed to happen until Saturday. Patient daughter and boyfriend also upset that the morphine and versed drips were started, though yesterday they specifically asked for Morphine.  Reassured the daughter that that is absolutely not the case at all, we are here to make and keep her comfortable and out of pain.  Noted patient's shaking (myoclonus) is very severe and we are trying to keep it managed as well. E-link physician called and made aware of the situation. E-link physician spoke to patient's daughter personally. Asked what he could do to help her. Patient stated "its to late now, its already done", declined MD's offer to restart tube feeds and d/c drips if that's what she wanted. Patient daughter Lindie Spruce. [employer] [place] Prob[employer] officer made aware of situation, came to talk to the family but they had already left.   Rise Paganini, RN

## 2015-04-01 NOTE — Progress Notes (Signed)
PULMONARY / CRITICAL CARE MEDICINE   Name: Anna Kemp MRN: [id number] DOB: 02-06-1966    ADMISSION DATE:  03/01/2015  REFERRING MD :  EDP  CHIEF COMPLAINT:  Post arrest   INITIAL PRESENTATION:  49yo female with hx cardiomyopathy presented 6/30 after VFib arrest.  Approx 25 minutes CPR.  Evaluated in ER and code STEMI called, pt taken emergently to cath lab.  PCCM called to admit for hypothermia protocol.   INDWELLING DEVICES:: ETT 6/30 >>  L [place] CVL 6/30 >>   MICRO DATA: Urine 6/30 >>  Blood 6/30 >>   ANTIMICROBIALS:    MAJOR EVENTS/TEST RESULTS: 6/30 Admitted to CCU after prolonged cardiac arrest. Hypothermia protocol initiated 6/30 CT head: NAICP 6/30 LHC: Normal coronary arteries except for thrombotic apical occlusion of LAD. Very severe global LV systolic dysfunction with LVEF 10%. Unsuccessful PCI of the apical LAD with attempted aspiration thrombectomy and PTCA. 7/01 EEG: consistent with a profound generalized cerebral dysfunction(encephalopathy) as can be seen with hypoxic encephalopathy. The presence of sedating medication and hypothermia does make this recording less definite for prognostication. There was no seizure or seizure predisposition recorded on this study 7/01 TTE: LVEF 15%. Grade 2 DD 7/2- myoclonus 7/2 eeg-Abnormal EEG due to the presence of a profound generalized cerebral dysfunction (encephalopathy) which can be seen with hypoxic encephalopathy 7/3 CT head>>>mild loss of gray-white differentiation within both cerebral hemispheres, though this is still partially preserved. This raises concern for some degree of anoxic brain injury. Associated decreased attenuation at the caudate bilaterally. Mild mass effect 03/28/15: Neg 1 liter, CT head anoxia  03/29/15: Comatose. Pupil+, Gag  +..Family at bedside - crying . Cards recommending CHF Service. 7/6 CT >>>anoxia, no changes EEG 7/6- no electrical acvivity 7/7- no pressors   SUBJECTIVE/OVERNIGHT/INTERVAL  HX EEG flat  VITAL SIGNS: Temp:  [99.4 F (37.4 C)-101.1 F (38.4 C)] 99.4 F (37.4 C) (07/08 0746) Pulse Rate:  [76-126] 76 (07/08 0800) Resp:  [15-23] 16 (07/08 0800) BP: (69-113)/(45-70) 77/45 mmHg (07/08 0800) SpO2:  [98 %-100 %] 100 % (07/08 0800) FiO2 (%):  [40 %] 40 % (07/08 0742) HEMODYNAMICS:   VENTILATOR SETTINGS: Vent Mode:  [-] PRVC FiO2 (%):  [40 %] 40 % Set Rate:  [16 bmp] 16 bmp Vt Set:  [500 mL] 500 mL PEEP:  [5 cmH20] 5 cmH20 Plateau Pressure:  [19 cmH20-21 cmH20] 21 cmH20 INTAKE / OUTPUT:  Intake/Output Summary (Last 24 hours) at 04/01/15 1158 Last data filed at 04/01/15 0700  Gross per 24 hour  Intake 770.66 ml  Output   1250 ml  Net -479.34 ml    PHYSICAL EXAMINATION: General: Looks critically ill Neuro:COmatose . Gag NO,. Pupil + 5mm , Corneal +  not follow commands, int myoclonus HEENT: jvd within Cardiovascular: s1 s2 TRR 112 Lungs: ronchi diffuse Abdomen:  Soft, BS wnl, no r/g Ext: cool, no edema    LABS: PULMONARY  Recent Labs Lab 03/26/15 0956 03/27/15 0530 03/29/15 1250 03/29/15 1344 03/30/15 1055 03/31/15 0532 03/31/15 0650  PHART 7.327* 7.435  --   --   --   --   --   PCO2ART 32.8* 27.5*  --   --   --   --   --   PO2ART 84.0 110*  --   --   --   --   --   HCO3 17.2* 18.3*  --   --   --   --   --   TCO2 18 19.1  --   --   --   --   --  O2SAT 96.0 98.8 87.1 73.9 71.8 92.6 78.3    CBC  Recent Labs Lab 03/29/15 0453 03/30/15 0450 03/31/15 0400  HGB 8.4* 9.3* 7.7*  HCT 30.3* 34.3* 29.8*  WBC 22.4* 18.6* 17.1*  PLT 302 365 279    COAGULATION No results for input(s): INR in the last 168 hours.  CARDIAC   No results for input(s): TROPONINI in the last 168 hours. No results for input(s): PROBNP in the last 168 hours.   CHEMISTRY  Recent Labs Lab 03/26/15 0446 03/27/15 0517 03/28/15 0430 03/29/15 0453 03/30/15 0450 03/30/15 1530 03/31/15 0400  NA 139 142 142 147* 155* 153* 151*  K 3.4* 3.6 3.2* 3.6  3.4* 3.5 3.6  CL 109 111 108 111 113* 119* 115*  CO2 19* 21* 24 26 29 27 28   GLUCOSE 116* 92 140* 130* 140* 140* 140*  BUN 19 20 22* 25* 32* 28* 25*  CREATININE 1.01* 0.83 0.89 1.07* 1.44* 1.19* 1.02*  CALCIUM 8.3* 8.5* 8.5* 9.1 9.5 8.2* 8.8*  MG 1.9 2.0 1.8 2.2 2.1  --  2.3  PHOS 4.3 3.1 2.7  --  4.7*  --  3.7   Estimated Creatinine Clearance: 75 mL/min (by C-G formula based on Cr of 1.02).   LIVER  Recent Labs Lab 03/27/15 0517 03/28/15 0430 03/29/15 0453  AST 129* 94* 77*  ALT 57* 46 38  ALKPHOS 51 53 51  BILITOT 1.2 1.2 0.9  PROT 5.9* 6.1* 6.7  ALBUMIN 2.7* 2.6* 2.6*     INFECTIOUS  Recent Labs Lab 03/30/15 1051 03/31/15 0400  LATICACIDVEN 1.3  --   PROCALCITON 1.02 0.70     ENDOCRINE CBG (last 3)   Recent Labs  03/31/15 0355 03/31/15 0724 03/31/15 1137  GLUCAP 130* 133* 141*         IMAGING x48h  - image(s) personally visualized  -   highlighted in bold Ct Head Wo Contrast  03/30/2015   CLINICAL DATA:  49 year old female status post cardiac arrest. Nonresponsive.  EXAM: CT HEAD WITHOUT CONTRAST  TECHNIQUE: Contiguous axial images were obtained from the base of the skull through the vertex without intravenous contrast.  COMPARISON:  Head CT 03/27/2015.  FINDINGS: Again noted is diffuse loss of gray-white differentiation throughout the cerebral hemispheres bilaterally (right greater than left), with some sulcal effacement, particularly overlying the right cerebral hemisphere, suggesting a background of cerebral edema, potentially related to anoxic brain injury. No acute intracranial hemorrhage, mass, hydrocephalus or abnormal intra or extra-axial fluid collections. Visualized paranasal sinuses and mastoids are well pneumatized. No acute displaced skull fractures.  IMPRESSION: 1. Findings remain concerning for potential anoxic brain injury, as above. No significant change compared to prior study 03/27/2015.   Electronically Signed   By: Trudie Reed M.D.    On: 03/30/2015 14:50   Dg Chest Port 1 View  03/31/2015   CLINICAL DATA:  Respiratory failure.  EXAM: PORTABLE CHEST - 1 VIEW  COMPARISON:  03/30/2015.  FINDINGS: Endotracheal tube, left subclavian line, NG tube in stable position. Stable cardiomegaly. Interim partial clearing of left lower lobe atelectasis and consolidation. Small left pleural effusion cannot be excluded. No pneumothorax.  IMPRESSION: 1.  Lines and tubes in stable position.  2. Interim partial complete clearing of left lower lobe atelectasis and consolidation . Small left pleural effusion cannot be excluded.  3.  Stable cardiomegaly.   Electronically Signed   By: Maisie Fus  Register   On: 03/31/2015 07:36        ASSESSMENT / PLAN:  NEUROLOGIC Acute anoxic encephalopathy - severe  Myoclonus - likley poor neuro recovery Acute resp failure MI  P:   Poor prognosis Family does not want heroics /trach Myoclonus still evident, have maxed versed Add depakote DNR, comfort in next 48 hrs, await fam from [place] Morphine at 10, family aproved Likely in am titrate morphine with int looks at cpap 5 ps 5 for comfort   Mcarthur Rossetti. Tyson Alias, MD, FACP Pgr: [phone] [facility]

## 2015-04-02 DIAGNOSIS — Z66 Do not resuscitate: Secondary | ICD-10-CM

## 2015-04-02 DIAGNOSIS — Z515 Encounter for palliative care: Secondary | ICD-10-CM

## 2015-04-02 LAB — NEURON-SPECIFIC ENOLASE(NSE), BLOOD: Neuron Specific Enolase: >150 ng/mL — ABNORMAL HIGH (ref <10.8)

## 2015-04-02 MED ORDER — WHITE PETROLATUM GEL
Status: AC
  Administered 2015-04-02: 0.2
  Filled 2015-04-02: qty 1

## 2015-04-04 ENCOUNTER — Telehealth: Payer: Self-pay

## 2015-04-04 LAB — CULTURE, BLOOD (ROUTINE X 2)
Culture: NO GROWTH
Culture: NO GROWTH

## 2015-04-04 NOTE — Telephone Encounter (Signed)
On 04/04/2015 I received a death certificate from [facility]. The death certificate is for cremation. The patient is a patient of Doctor Ramaswamy. The certificate will be taken to Pulmonary Unit this afternoon for Doctor Craige Cotta to sign the certificate due to Doctor Marchelle Gearing being on vacation this week. On 2015/04/14 I received the original copy of death certificate and I am taking the original to [facility] for him to sign the death certificate. On April 14, 2015 I received the death certificate back from Doctor [place]. I got the death certificate ready for pickup and called the funeral home to let them know the death certificate was ready for pickup. I faxed them a copy and mailed the original to the [facility].

## 2015-04-17 NOTE — Discharge Summary (Signed)
DISCHARGE SUMMARY    Date of admit: 03-26-15 10:32 AM Date of discharge: 04/13/2015  5:33 PM Length of Stay: 9 days  PCP is No primary care provider on file.   PROBLEM LIST  Active Problems:   Cardiac arrest   Acute respiratory failure with hypoxemia   Cardiogenic shock   STEMI (ST elevation myocardial infarction)      Anoxia   Anoxia of brain   Coma   Anoxic brain injury   DNR (do not resuscitate)   DNAR (do not attempt resuscitation)   Encounter for palliative care    SUMMARY Anna Kemp was 49 y.o. patient with    has a past medical history of CHF (congestive heart failure).   has past surgical history that includes Cardiac catheterization (N/A, 03-26-15).   Admitted on Mar 26, 2015 with    48yo female with hx cardiomyopathy presented 6/30 after VFib arrest. Approx 25 minutes CPR. Evaluated in ER and code STEMI called, pt taken emergently to cath lab. PCCM called to admit for hypothermia protocol. Urube Tox at admit 03-26-2015 shows positive for AMPHETAMINE   MAJOR EVENTS/TEST RESULTS: 6/30 Admitted to CCU after prolonged cardiac arrest. Hypothermia protocol initiated 6/30 CT head: NAICP 6/30 LHC: Normal coronary arteries except for thrombotic apical occlusion of LAD. Very severe global LV systolic dysfunction with LVEF 10%. Unsuccessful PCI of the apical LAD with attempted aspiration thrombectomy and PTCA. 7/01 EEG: consistent with a profound generalized cerebral dysfunction(encephalopathy) as can be seen with hypoxic encephalopathy. The presence of sedating medication and hypothermia does make this recording less definite for prognostication. There was no seizure or seizure predisposition recorded on this study 7/01 TTE: LVEF 15%. Grade 2 DD 7/2- myoclonus 7/2 eeg-Abnormal EEG due to the presence of a profound generalized cerebral dysfunction (encephalopathy) which can be seen with hypoxic encephalopathy 7/3 CT head>>>mild loss of gray-white  differentiation within both cerebral hemispheres, though this is still partially preserved. This raises concern for some degree of anoxic brain injury. Associated decreased attenuation at the caudate bilaterally. Mild mass effect 03/28/15: Neg 1 liter, CT head anoxia  03/29/15: Comatose. Pupil+, Gag +..Family at bedside - crying .  7/6 CT >>>anoxia, no changes EEG 7/6- no electrical acvivity 7/7- no pressors   04/16/2015: diffuse myoclonus +. Pupils reactive. Corneal absent on left (worse) but present on right (diminished), no motor. Gag absent but cough + on deep suction and breathes over vent . On morphie and versed gtt. Per RN: likely / possible / anticipating terminal wean 04/11/2015  Family conversations held throughout. They understood the grave neurologic progress and patient was terminally weaned 03/27/2015 and she expired    SIGNED Dr. Kalman Shan, M.D., [facility].C.P Pulmonary and Critical Care Medicine Staff Physician [facility] [place] Pulmonary and Critical Care Pager: [phone], If no answer or between  15:00h - 7:00h: call [phone]  04/17/2015 3:25 PM

## 2015-04-25 NOTE — Progress Notes (Signed)
PULMONARY / CRITICAL CARE MEDICINE   Name: Anna Kemp MRN: [id number] DOB: 1966-07-20    ADMISSION DATE:  03/17/2015  REFERRING MD :  EDP  CHIEF COMPLAINT:  Post arrest   INITIAL PRESENTATION:  49yo female with hx cardiomyopathy presented 6/30 after VFib arrest.  Approx 25 minutes CPR.  Evaluated in ER and code STEMI called, pt taken emergently to cath lab.  PCCM called to admit for hypothermia protocol.   INDWELLING DEVICES:: ETT 6/30 >>  L [place] CVL 6/30 >>   MICRO DATA: Urine 6/30 >>  Blood 6/30 >>   ANTIMICROBIALS:    MAJOR EVENTS/TEST RESULTS: 6/30 Admitted to CCU after prolonged cardiac arrest. Hypothermia protocol initiated 6/30 CT head: NAICP 6/30 LHC: Normal coronary arteries except for thrombotic apical occlusion of LAD. Very severe global LV systolic dysfunction with LVEF 10%. Unsuccessful PCI of the apical LAD with attempted aspiration thrombectomy and PTCA. 7/01 EEG: consistent with a profound generalized cerebral dysfunction(encephalopathy) as can be seen with hypoxic encephalopathy. The presence of sedating medication and hypothermia does make this recording less definite for prognostication. There was no seizure or seizure predisposition recorded on this study 7/01 TTE: LVEF 15%. Grade 2 DD 7/2- myoclonus 7/2 eeg-Abnormal EEG due to the presence of a profound generalized cerebral dysfunction (encephalopathy) which can be seen with hypoxic encephalopathy 7/3 CT head>>>mild loss of gray-white differentiation within both cerebral hemispheres, though this is still partially preserved. This raises concern for some degree of anoxic brain injury. Associated decreased attenuation at the caudate bilaterally. Mild mass effect 03/28/15: Neg 1 liter, CT head anoxia  03/29/15: Comatose. Pupil+, Gag  +..Family at bedside - crying . Cards recommending CHF Service. 7/6 CT >>>anoxia, no changes EEG 7/6- no electrical acvivity 7/7- no pressors   SUBJECTIVE/OVERNIGHT/INTERVAL  HX 04/25/2015: diffuse myoclonus +. Pupils reactive. Corneal absent on left (worse) but present on right (diminished), no motor. Gag absent but cough + on deep suction and breathes over vent . On morphie and versed gtt. Per RN: likely / possible / anticipating terminal wean 2015-04-25 but family yet to show up  VITAL SIGNS: Temp:  [100.2 F (37.9 C)-102.8 F (39.3 C)] 100.2 F (37.9 C) (07/09 0726) Pulse Rate:  [75-96] 80 (07/09 0800) Resp:  [16] 16 (07/09 0800) BP: (69-85)/(41-51) 82/50 mmHg (07/09 0800) SpO2:  [100 %] 100 % (07/09 0800) FiO2 (%):  [40 %] 40 % (07/09 0800) HEMODYNAMICS:   VENTILATOR SETTINGS: Vent Mode:  [-] PRVC FiO2 (%):  [40 %] 40 % Set Rate:  [16 bmp] 16 bmp Vt Set:  [500 mL] 500 mL PEEP:  [5 cmH20] 5 cmH20 Plateau Pressure:  [18 cmH20-20 cmH20] 20 cmH20 INTAKE / OUTPUT:  Intake/Output Summary (Last 24 hours) at 2015-04-25 0941 Last data filed at 04/25/2015 0900  Gross per 24 hour  Intake 452.17 ml  Output    550 ml  Net -97.83 ml    PHYSICAL EXAMINATION: General: Looks critically ill Neuro:COmatose . RASS -5, Myoclonus + HEENT: jvd within Cardiovascular: s1 s2 TRR 112 Lungs: ronchi diffuse Abdomen:  Soft, BS wnl, no r/g Ext: cool, no edema    LABS: PULMONARY  Recent Labs Lab 03/26/15 0956 03/27/15 0530 03/29/15 1250 03/29/15 1344 03/30/15 1055 03/31/15 0532 03/31/15 0650  PHART 7.327* 7.435  --   --   --   --   --   PCO2ART 32.8* 27.5*  --   --   --   --   --   PO2ART 84.0 110*  --   --   --   --   --  HCO3 17.2* 18.3*  --   --   --   --   --   TCO2 18 19.1  --   --   --   --   --   O2SAT 96.0 98.8 87.1 73.9 71.8 92.6 78.3    CBC  Recent Labs Lab 03/29/15 0453 03/30/15 0450 03/31/15 0400  HGB 8.4* 9.3* 7.7*  HCT 30.3* 34.3* 29.8*  WBC 22.4* 18.6* 17.1*  PLT 302 365 279    COAGULATION No results for input(s): INR in the last 168 hours.  CARDIAC   No results for input(s): TROPONINI in the last 168 hours. No results for  input(s): PROBNP in the last 168 hours.   CHEMISTRY  Recent Labs Lab 03/27/15 0517 03/28/15 0430 03/29/15 0453 03/30/15 0450 03/30/15 1530 03/31/15 0400  NA 142 142 147* 155* 153* 151*  K 3.6 3.2* 3.6 3.4* 3.5 3.6  CL 111 108 111 113* 119* 115*  CO2 21* GLUCOSE 92 140* 130* 140* 140* 140*  BUN 20 22* 25* 32* 28* 25*  CREATININE 0.83 0.89 1.07* 1.44* 1.19* 1.02*  CALCIUM 8.5* 8.5* 9.1 9.5 8.2* 8.8*  MG 2.0 1.8 2.2 2.1  --  2.3  PHOS 3.1 2.7  --  4.7*  --  3.7   Estimated Creatinine Clearance: 75 mL/min (by C-G formula based on Cr of 1.02).   LIVER  Recent Labs Lab 03/27/15 0517 03/28/15 0430 03/29/15 0453  AST 129* 94* 77*  ALT 57* 46 38  ALKPHOS 51 53 51  BILITOT 1.2 1.2 0.9  PROT 5.9* 6.1* 6.7  ALBUMIN 2.7* 2.6* 2.6*     INFECTIOUS  Recent Labs Lab 03/30/15 1051 03/31/15 0400  LATICACIDVEN 1.3  --   PROCALCITON 1.02 0.70     ENDOCRINE CBG (last 3)   Recent Labs  03/31/15 0355 03/31/15 0724 03/31/15 1137  GLUCAP 130* 133* 141*         IMAGING x48h  - image(s) personally visualized  -   highlighted in bold No results found.   PROB   ICD-9-CM ICD-10-CM   1. Cardiac arrest 427.5 I46.9 DG Chest [facility] 1 View     DG Chest Port 1 View  2. ST elevation myocardial infarction (STEMI), unspecified artery 410.90 I21.3   3. Ventricular fibrillation 427.41 I49.01   4. Encounter for orogastric (OG) tube placement V53.59 Z46.59 DG Abd Portable 1V     DG Abd Portable 1V  5. Pulmonary edema 514 J81.1 DG Chest Port 1 View     DG Chest Port 1 View  6. Anoxia 799.02 R09.02 CT Head Wo Contrast     CT Head Wo Contrast     CANCELED: CT Head Wo Contrast     CANCELED: CT Head Wo Contrast     CANCELED: CT Portable Head w/o cm     CANCELED: CT Portable Head w/o cm  7. Anoxia of brain 348.1 G93.1 CANCELED: CT Portable Head w/o cm     CANCELED: CT Portable Head w/o cm  8. Endotracheal tube present V49.89 Z78.9   9. Coma 780.01 R40.20 CT  Head Wo Contrast     CT Head Wo Contrast     Patient Active Problem List   Diagnosis Date Noted  . DNR (do not resuscitate) 01-May-2015  . Comfort measures only status 2015/05/01  . DNAR (do not attempt resuscitation) May 01, 2015  . Encounter for palliative care 2015-05-01  . Coma 03/30/2015  . Anoxic brain injury   .  Anoxia   . Anoxia of brain   . Cardiac arrest 03/21/2015  . Acute respiratory failure with hypoxemia 03/14/2015  . Cardiogenic shock 03/20/2015  . STEMI (ST elevation myocardial infarction) 03/12/2015  . Heart attack       ASSESSMENT / PLAN:  NEUROLOGIC Acute anoxic encephalopathy - severe  Myoclonus - likley poor neuro recovery Acute resp failure MI Coma  Poor prognosis Family does not want heroics /trach per prior PCCM discussion Myoclonus still evident, have maxed versed  PLAN depakote DNR, Morphine and Versed  gtt  family aproved Await family from [place] before terminal wean      Dr. Kalman Shan, M.D., [facility].C.P Pulmonary and Critical Care Medicine Staff Physician [place] [facility] [place] Pulmonary and Critical Care Pager: [phone], If no answer or between  15:00h - 7:00h: call [phone]  2015-04-18 9:47 AM

## 2015-04-25 NOTE — Progress Notes (Signed)
Family meeting at bedside. Plan to go ahead with extubation according to daughter wishes.  Pt extubated per protocol.  Family at bedside. Comfort measures ongoing for patient and family. Will continue to acess.

## 2015-04-25 NOTE — Progress Notes (Addendum)
Patient pronounced by two RNs per protocol @ 1548. Comfort measures on going.

## 2015-04-25 NOTE — Procedures (Signed)
Extubation Procedure Note  Patient Details:   Name: Anna Kemp DOB: 1966-03-31 MRN: [id number]   Airway Documentation:     Evaluation  O2 sats: transiently fell during during procedure Complications: Complications of desating   Patient did tolerate procedure well. Bilateral Breath Sounds: Rhonchi Suctioning: Oral, Airway No   PT was a terminal extubation   Krishan Mcbreen, [place] Lope 04/15/15, 1:01 PM

## 2015-04-25 NOTE — Progress Notes (Signed)
Family at bedside.  Answered all question  Pt extubated to room air.  Plan Cont current comfort oriented care    Simonne Martinet ACNP-BC [facility] Pulmonary/Critical Care Pager # [phone] OR # [phone] if no answer

## 2015-04-25 NOTE — Progress Notes (Signed)
210 cc of morphine wasted from a 250 cc bag and 40 cc of Versed wasted from a 50 cc bag. Witnessed per two RNs

## 2015-04-25 DEATH — deceased

## 2017-01-18 IMAGING — CT CT HEAD W/O CM
1 series · 15 of 30 positions shown, 19 images · non-contrast
Comparison: CT of the head performed 03/24/2015

CLINICAL DATA: Acute onset of anoxia.  Initial encounter.

EXAM:
CT HEAD WITHOUT CONTRAST
TECHNIQUE: Contiguous axial images were obtained from the base of the skull
through the vertex without intravenous contrast.

[Series 2: head 5.0 h30s · axial · 0.41mm/px · z∈[+389,+524]mm · 15 of 30 slices shown, 19 images]
[im 2/30  brain]
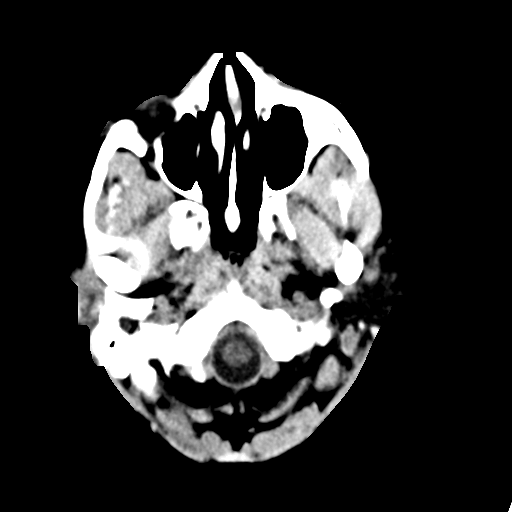
[im 2/30  bone]
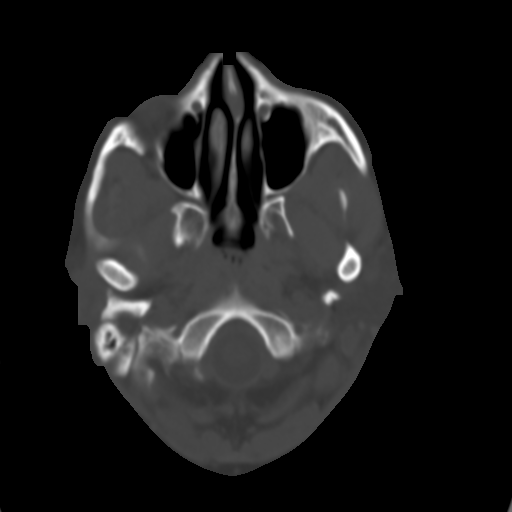
[im 4/30  brain]
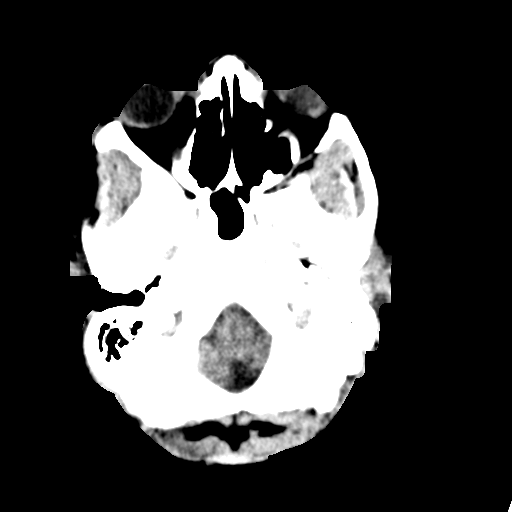
[im 6/30  brain]
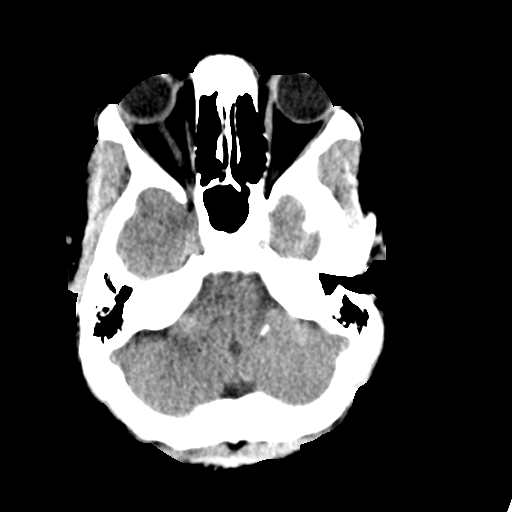
[im 8/30  brain]
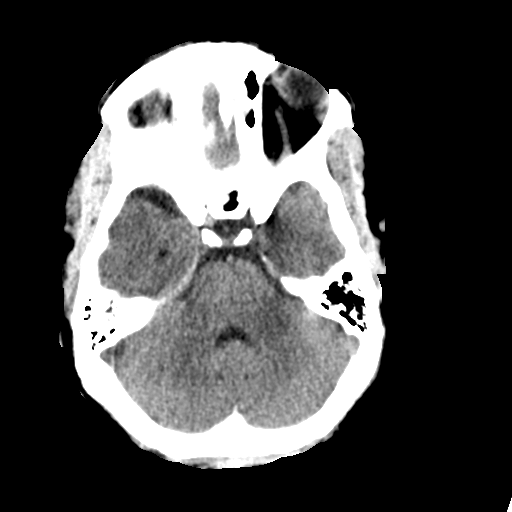
[im 10/30  brain]
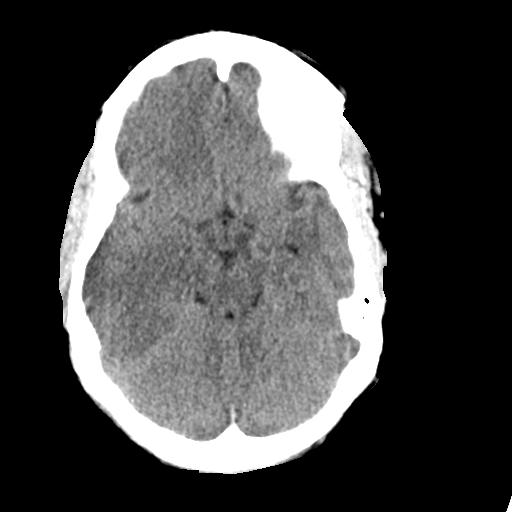
[im 10/30  bone]
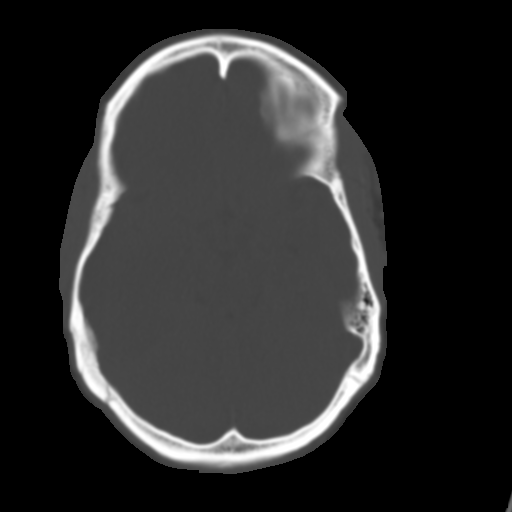
[im 12/30  brain]
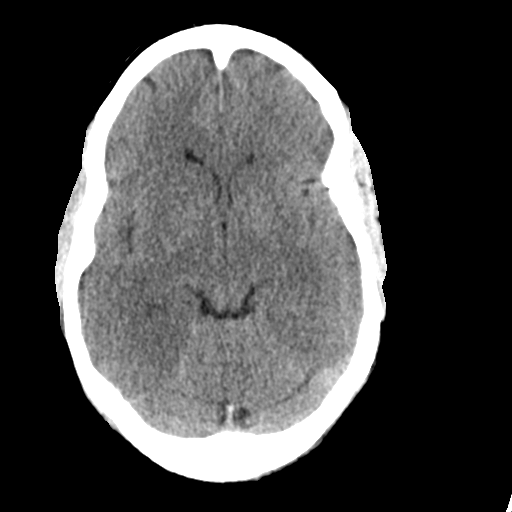
[im 14/30  brain]
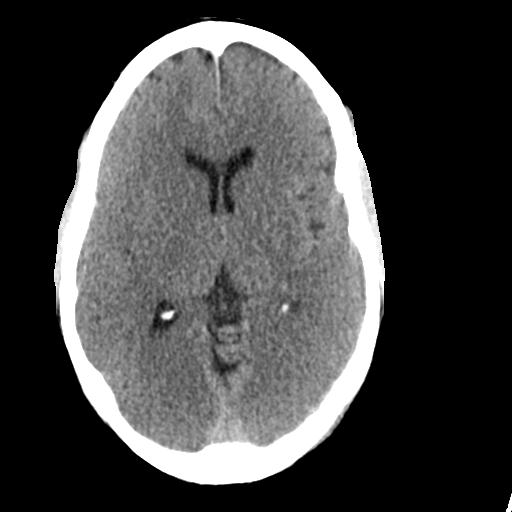
[im 16/30  brain]
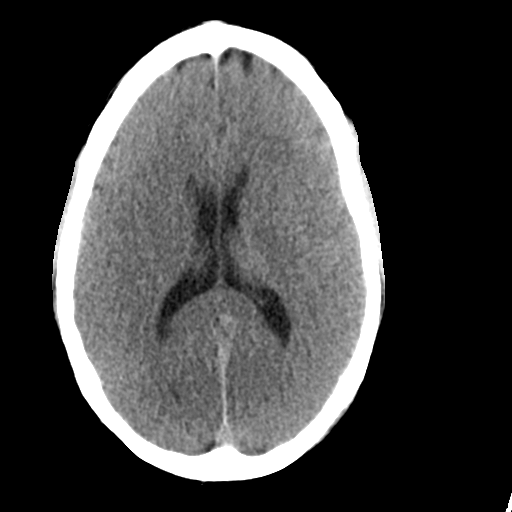
[im 17/30  brain]
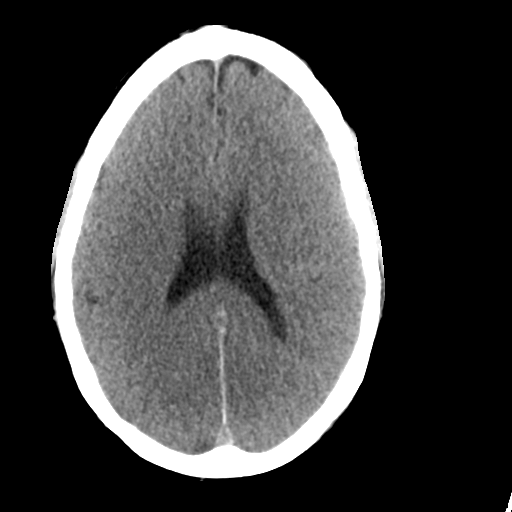
[im 17/30  bone]
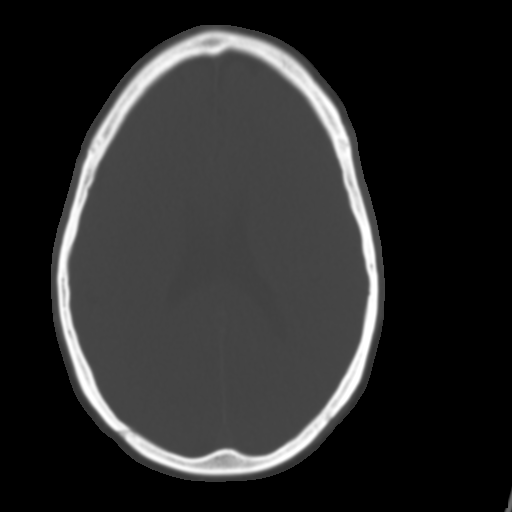
[im 19/30  brain]
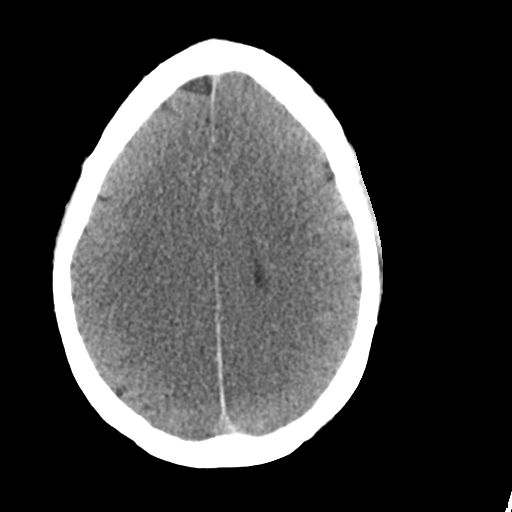
[im 21/30  brain]
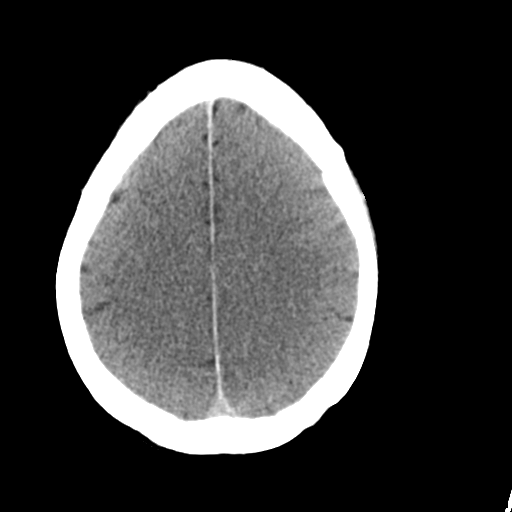
[im 23/30  brain]
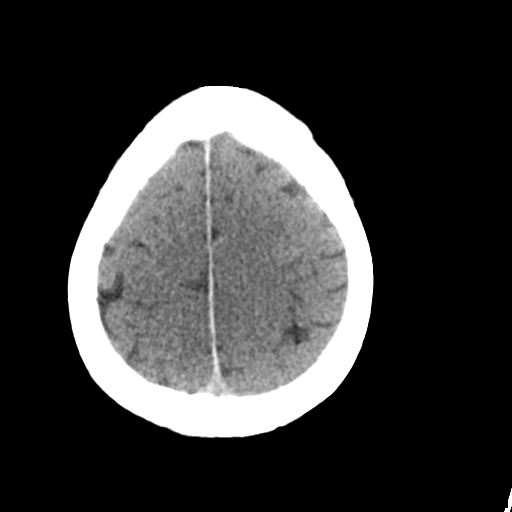
[im 25/30  brain]
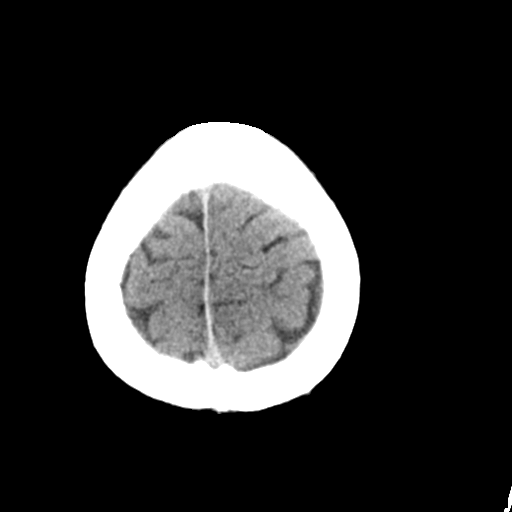
[im 25/30  bone]
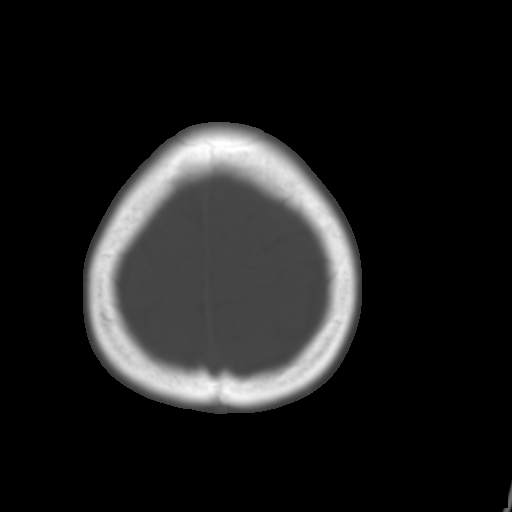
[im 27/30  brain]
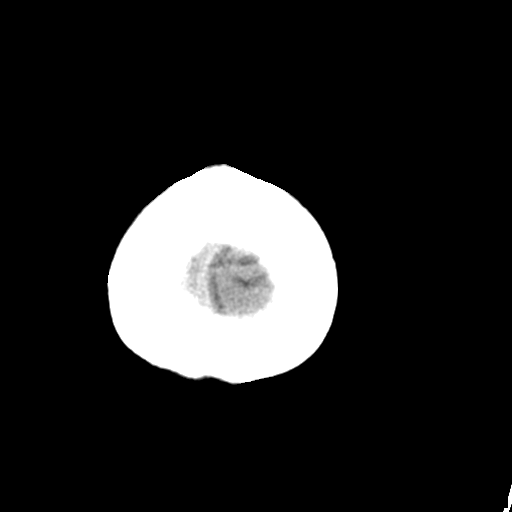
[im 29/30  brain]
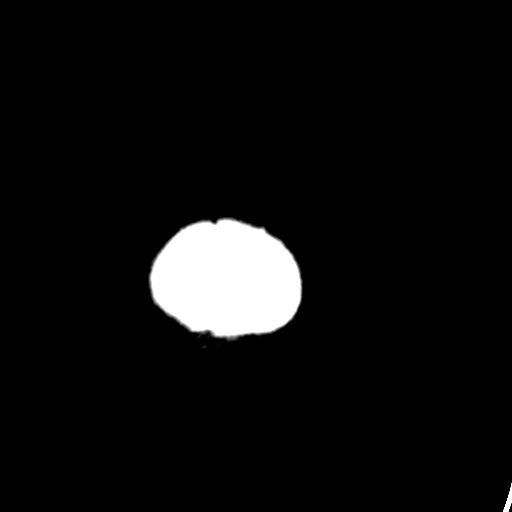

[15 of 30 positions shown; findings below may reference images not displayed]

FINDINGS: There is suggestion of mild loss of gray-white differentiation
within the cerebral hemispheres, though this is still partially
preserved. The cerebellar hemispheres appear grossly unremarkable.
Decreased attenuation at the caudate bilaterally could reflect
ischemic injury.

Underlying mild mass effect is noted, without evidence of midline
shift. The third and lateral ventricles are grossly unremarkable.
The fourth ventricle is within normal limits.

There is no evidence of fracture; visualized osseous structures are
unremarkable in appearance. The orbits are within normal limits. The
paranasal sinuses and mastoid air cells are well-aerated. No
significant soft tissue abnormalities are seen.
IMPRESSION: Suggestion of mild loss of gray-white differentiation within both
cerebral hemispheres, though this is still partially preserved. This
raises concern for some degree of anoxic brain injury. Associated
decreased attenuation at the caudate bilaterally. Mild mass effect,
without evidence of midline shift.

These results were called by telephone at the time of interpretation
on 03/27/2015 at [DATE] to Jjd Lv RN on [id number], who verbally
acknowledged these results.

## 2017-01-22 IMAGING — CR DG CHEST 1V PORT
1 series · 1 of 1 positions shown · non-contrast
Comparison: 03/30/2015.

CLINICAL DATA: Respiratory failure.

EXAM:
PORTABLE CHEST - 1 VIEW

[AP]
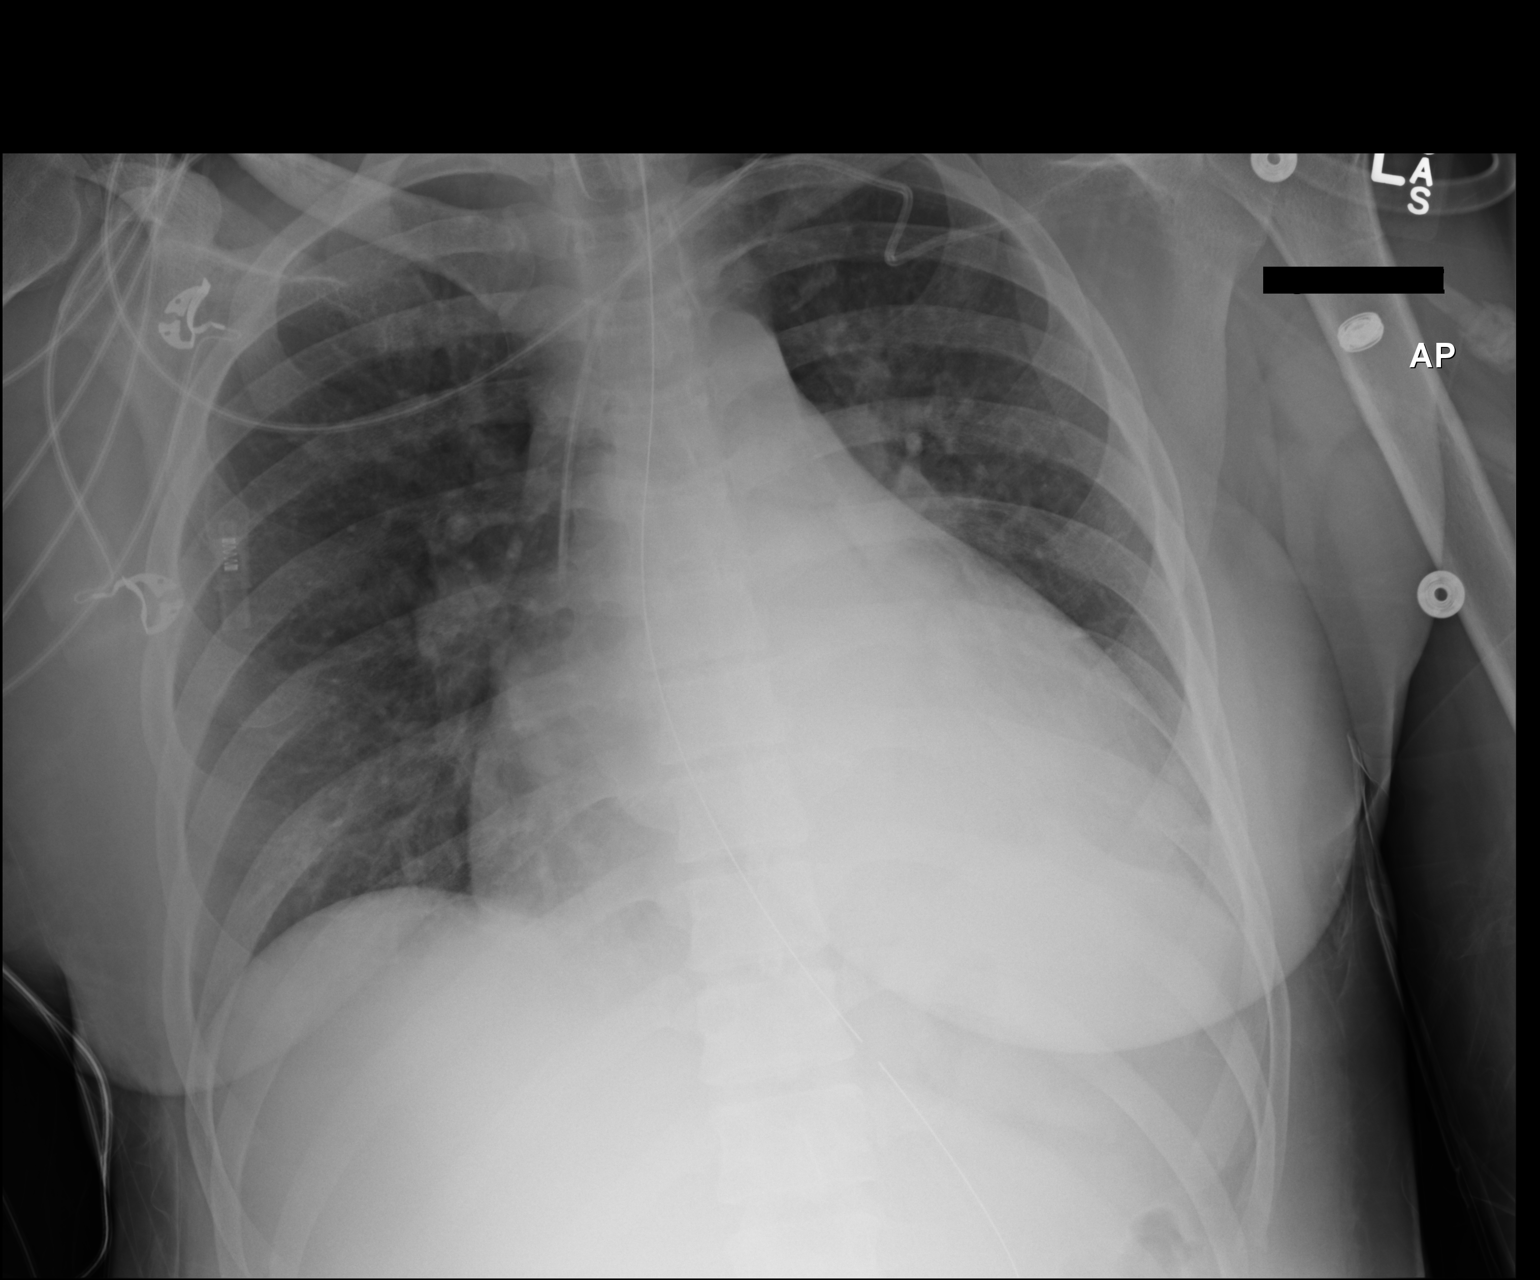

[1 of 1 positions shown; findings below may reference images not displayed]

FINDINGS: Endotracheal tube, left subclavian line, NG tube in stable position.
Stable cardiomegaly. Interim partial clearing of left lower lobe
atelectasis and consolidation. Small left pleural effusion cannot be
excluded. No pneumothorax.
IMPRESSION: 1.  Lines and tubes in stable position.

2. Interim partial complete clearing of left lower lobe atelectasis
and consolidation . Small left pleural effusion cannot be excluded.

3.  Stable cardiomegaly.
